# Patient Record
Sex: Male | Born: 1985 | Race: Black or African American | Hispanic: Yes | Marital: Married | Smoking: Never smoker
Health system: Southern US, Community
[De-identification: ages and names within clinical notes are randomized; demographics above are authoritative.]

## PROBLEM LIST (undated history)

## (undated) DIAGNOSIS — R0602 Shortness of breath: Secondary | ICD-10-CM

## (undated) DIAGNOSIS — K589 Irritable bowel syndrome without diarrhea: Secondary | ICD-10-CM

## (undated) DIAGNOSIS — M549 Dorsalgia, unspecified: Secondary | ICD-10-CM

## (undated) DIAGNOSIS — F419 Anxiety disorder, unspecified: Secondary | ICD-10-CM

## (undated) DIAGNOSIS — R079 Chest pain, unspecified: Secondary | ICD-10-CM

## (undated) DIAGNOSIS — K5900 Constipation, unspecified: Secondary | ICD-10-CM

## (undated) DIAGNOSIS — J309 Allergic rhinitis, unspecified: Secondary | ICD-10-CM

## (undated) DIAGNOSIS — K219 Gastro-esophageal reflux disease without esophagitis: Secondary | ICD-10-CM

## (undated) DIAGNOSIS — E739 Lactose intolerance, unspecified: Secondary | ICD-10-CM

## (undated) DIAGNOSIS — G4733 Obstructive sleep apnea (adult) (pediatric): Secondary | ICD-10-CM

## (undated) DIAGNOSIS — Z91018 Allergy to other foods: Secondary | ICD-10-CM

## (undated) DIAGNOSIS — J45909 Unspecified asthma, uncomplicated: Secondary | ICD-10-CM

## (undated) DIAGNOSIS — G8929 Other chronic pain: Secondary | ICD-10-CM

## (undated) DIAGNOSIS — R002 Palpitations: Secondary | ICD-10-CM

## (undated) HISTORY — PX: TOE SURGERY: SHX1073

## (undated) HISTORY — DX: Constipation, unspecified: K59.00

## (undated) HISTORY — DX: Dorsalgia, unspecified: M54.9

## (undated) HISTORY — DX: Shortness of breath: R06.02

## (undated) HISTORY — DX: Other chronic pain: G89.29

## (undated) HISTORY — DX: Lactose intolerance, unspecified: E73.9

## (undated) HISTORY — DX: Allergy to other foods: Z91.018

## (undated) HISTORY — DX: Chest pain, unspecified: R07.9

## (undated) HISTORY — DX: Irritable bowel syndrome, unspecified: K58.9

## (undated) HISTORY — DX: Gastro-esophageal reflux disease without esophagitis: K21.9

## (undated) HISTORY — DX: Morbid (severe) obesity due to excess calories: E66.01

## (undated) HISTORY — DX: Allergic rhinitis, unspecified: J30.9

## (undated) HISTORY — DX: Palpitations: R00.2

## (undated) HISTORY — DX: Obstructive sleep apnea (adult) (pediatric): G47.33

## (undated) HISTORY — PX: HERNIA REPAIR: SHX51

---

## 2016-08-03 DIAGNOSIS — G4733 Obstructive sleep apnea (adult) (pediatric): Secondary | ICD-10-CM

## 2016-08-03 DIAGNOSIS — J309 Allergic rhinitis, unspecified: Secondary | ICD-10-CM

## 2016-08-03 HISTORY — DX: Allergic rhinitis, unspecified: J30.9

## 2016-08-03 HISTORY — DX: Obstructive sleep apnea (adult) (pediatric): G47.33

## 2016-08-03 HISTORY — DX: Morbid (severe) obesity due to excess calories: E66.01

## 2017-12-02 ENCOUNTER — Emergency Department (INDEPENDENT_AMBULATORY_CARE_PROVIDER_SITE_OTHER)
Admission: EM | Admit: 2017-12-02 | Discharge: 2017-12-02 | Disposition: A | Discharge status: Home / Self Care | Payer: Self-pay | Source: Home / Self Care | Attending: Family Medicine | Admitting: Family Medicine

## 2017-12-02 ENCOUNTER — Other Ambulatory Visit: Payer: Self-pay

## 2017-12-02 ENCOUNTER — Emergency Department (INDEPENDENT_AMBULATORY_CARE_PROVIDER_SITE_OTHER): Payer: Self-pay

## 2017-12-02 DIAGNOSIS — S43401A Unspecified sprain of right shoulder joint, initial encounter: Secondary | ICD-10-CM

## 2017-12-02 DIAGNOSIS — M25511 Pain in right shoulder: Secondary | ICD-10-CM

## 2017-12-02 HISTORY — DX: Unspecified asthma, uncomplicated: J45.909

## 2017-12-02 NOTE — ED Provider Notes (Signed)
[facility]    CSN: [id number] Arrival date & time: 12/02/17  1313     History   Chief Complaint Chief Complaint  Patient presents with  . Shoulder Pain    HPI Darren Crawford is a 32 y.o. male.   Patient was playing basketball 5 days ago when he threw the ball overhand with his right arm.  His right shoulder was mildly sore afterwards, and the next day while showering he felt a "popping" sensation in his right shoulder.  Since then he has had increased pain and limited range of motion.  The history is provided by the patient.  Shoulder Injury  This is a new problem. Episode onset: 5 days ago. The problem occurs constantly. The problem has not changed since onset.Pertinent negatives include no chest pain. Exacerbated by: shoulder abduction. Nothing relieves the symptoms. Treatments tried: Aleve. The treatment provided no relief.    Past Medical History:  Diagnosis Date  . Asthma     There are no active problems to display for this patient.   History reviewed. No pertinent surgical history.     Home Medications    Prior to Admission medications   Medication Sig Start Date End Date Taking? Authorizing Provider  albuterol (PROVENTIL) (2.5 MG/3ML) 0.083% nebulizer solution Take 2.5 mg by nebulization every 6 (six) hours as needed for wheezing or shortness of breath.   Yes [provider]  beclomethasone (QVAR) 40 MCG/ACT inhaler Inhale 1 puff into the lungs daily.   Yes [provider]  cetirizine (ZYRTEC) 10 MG chewable tablet Chew 10 mg by mouth daily.   Yes [provider]  fluticasone (FLONASE) 50 MCG/ACT nasal spray Place into both nostrils daily.   Yes [provider]    Family History Family History  Family history unknown: Yes    Social History Social History   Tobacco Use  . Smoking status: Never Smoker  . Smokeless tobacco: Never Used  Substance Use Topics  . Alcohol use: Never    Frequency: Never  .  Drug use: Never     Allergies   Penicillins   Review of Systems Review of Systems  Cardiovascular: Negative for chest pain.  All other systems reviewed and are negative.    Physical Exam Triage Vital Signs ED Triage Vitals  Enc Vitals Group     BP 12/02/17 1400 108/84     Pulse Rate 12/02/17 1400 84     Resp 12/02/17 1400 18     Temp 12/02/17 1400 98.2 F (36.8 C)     Temp Source 12/02/17 1400 Oral     SpO2 12/02/17 1400 98 %     Weight 12/02/17 1401 282 lb (127.9 kg)     Height 12/02/17 1401 6\' 1"  (1.854 m)     Head Circumference --      Peak Flow --      Pain Score 12/02/17 1400 0     Pain Loc --      Pain Edu? --      Excl. in GC? --    No data found.  Updated Vital Signs BP 108/84 (BP Location: Right Arm)   Pulse 84   Temp 98.2 F (36.8 C) (Oral)   Resp 18   Ht 6\' 1"  (1.854 m)   Wt 282 lb (127.9 kg)   SpO2 98%   BMI 37.21 kg/m   Visual Acuity Right Eye Distance:   Left Eye Distance:   Bilateral Distance:    Right Eye  Near:   Left Eye Near:    Bilateral Near:     Physical Exam  Constitutional: He appears well-developed and well-nourished. No distress.  HENT:  Head: Normocephalic.  Eyes: Pupils are equal, round, and reactive to light.  Neck: Normal range of motion.  Cardiovascular: Normal heart sounds.  Pulmonary/Chest: Breath sounds normal.  Musculoskeletal:       Right shoulder: He exhibits decreased range of motion and tenderness.       Arms: Right shoulder has mild tenderness posteriorly.  There is good internal/external rotational strength. The patient cannot actively abduct above horizontal, and cannot passively abduct more than 10 degrees above horizontal.  Apley's test positive.  Empty can +/-      Neurological: He is alert.  Skin: Skin is warm and dry.  Nursing note and vitals reviewed.    UC Treatments / Results  Labs (all labs ordered are listed, but only abnormal results are displayed) Labs Reviewed - No data to  display  EKG None  Radiology Dg Shoulder Right  Result Date: 12/02/2017 CLINICAL DATA:  32 year old male with history of right shoulder injury playing basketball 1 week ago complaining of persistent pain. EXAM: RIGHT SHOULDER - 2+ VIEW COMPARISON:  No priors. FINDINGS: Small loose body within the joint space best appreciated adjacent to the superior aspect of the acetabulum. The chronicity of this finding is uncertain. No definite acute displaced fracture, subluxation or dislocation otherwise noted. IMPRESSION: 1. Small loose body in the superior aspect of the glenohumeral joint space. Electronically Signed   By: Trudie Reed M.D.   On: 12/02/2017 14:54    Procedures Procedures (including critical care time)  Medications Ordered in UC Medications - No data to display  Initial Impression / Assessment and Plan / UC Course  I have reviewed the triage vital signs and the nursing notes.  Pertinent labs & imaging results that were available during my care of the patient were reviewed by me and considered in my medical decision making (see chart for details).    Suspect rotator cuff injury.  Sling dispensed. Because of the finding of a loose body in the glenohumeral joint space, recommend follow-up with Sports medicine physician for further evaluation.   Final Clinical Impressions(s) / UC Diagnoses   Final diagnoses:  Sprain of right shoulder, unspecified shoulder sprain type, initial encounter     Discharge Instructions     Wear sling for about 5 days.  Apply ice pack for 20 to 30 minutes, 3 to 4 times daily  Continue until pain and swelling decrease.  May continue Aleve, two tabs every 12 hours.      ED Prescriptions    None         Lattie Haw, MD 12/04/17 [phone]

## 2017-12-02 NOTE — Discharge Instructions (Addendum)
Wear sling for about 5 days.  Apply ice pack for 20 to 30 minutes, 3 to 4 times daily  Continue until pain and swelling decrease.  May continue Aleve, two tabs every 12 hours.

## 2017-12-02 NOTE — ED Triage Notes (Signed)
Pt c/o R shoulder pain since last week. Was playing basketball and "threw a basketball like a baseball" - was showering the next day and heard a "pop" in his shoulder. Pain level 5 with limited ROM.

## 2017-12-13 ENCOUNTER — Encounter: Payer: Self-pay | Admitting: Family Medicine

## 2017-12-13 ENCOUNTER — Ambulatory Visit (INDEPENDENT_AMBULATORY_CARE_PROVIDER_SITE_OTHER): Payer: Self-pay | Admitting: Family Medicine

## 2017-12-13 VITALS — BP 119/73 | HR 80 | Ht 73.0 in | Wt 283.0 lb

## 2017-12-13 DIAGNOSIS — M7581 Other shoulder lesions, right shoulder: Secondary | ICD-10-CM

## 2017-12-13 DIAGNOSIS — S43431A Superior glenoid labrum lesion of right shoulder, initial encounter: Secondary | ICD-10-CM | POA: Insufficient documentation

## 2017-12-13 MED ORDER — DICLOFENAC SODIUM 1 % TD GEL: 2.0000 g | Freq: Four times a day (QID) | TRANSDERMAL | 11 refills | Status: DC

## 2017-12-13 NOTE — Patient Instructions (Signed)
Thank you for coming in today. Continue aleve as needed.  Apply the diclofenac gel up to 4x daily for pain as needed.  Do the exercises we dicussed.  Schedule with PT in 1-2 weeks especially if not better.  Recheck with me in 6 weeks.  Return sooner if needed or if worsening.

## 2017-12-14 ENCOUNTER — Encounter: Payer: Self-pay | Admitting: Family Medicine

## 2017-12-14 NOTE — Progress Notes (Signed)
Subjective:    I'm seeing this patient as a consultation for:  Dr Cathren Harsh  CC: Right shoulder pain.  HPI: Darren Crawford developed pain in his right shoulder a few weeks ago when throwing a basketball during a basketball game.  He threw it like a baseball and notes later that day he developed pain in his right shoulder.  He notes pain initially was in the lateral upper arm and quite significant and painful with overhead motion.  He denies any radiating pain or numbness.  He notes of the last few weeks the pain has been improving but is still quite significant present in the lateral upper arm.  He was seen in urgent care on June 26 where x-rays were unremarkable.  He has been using naproxen for pain control which has helped.  He notes pain in his shoulder is worse with overhead motion and at bedtime.  He denies any prior history of shoulder injury or any recent injury to the shoulder.   Past medical history, Surgical history, Family history not pertinant except as noted below, Social history, Allergies, and medications have been entered into the medical record, reviewed, and no changes needed.   Review of Systems: No headache, visual changes, nausea, vomiting, diarrhea, constipation, dizziness, abdominal pain, skin rash, fevers, chills, night sweats, weight loss, swollen lymph nodes, body aches, joint swelling, muscle aches, chest pain, shortness of breath, mood changes, visual or auditory hallucinations.   Objective:    Vitals:   12/13/17 1317  BP: 119/73  Pulse: 80   General: Well Developed, well nourished, and in no acute distress.  Neuro/Psych: Alert and oriented x3, extra-ocular muscles intact, able to move all 4 extremities, sensation grossly intact. Skin: Warm and dry, no rashes noted.  Respiratory: Not using accessory muscles, speaking in full sentences, trachea midline.  Cardiovascular: Pulses palpable, no extremity edema. Abdomen: Does not appear distended. MSK:  C-spine nontender to  midline normal neck motion. Right shoulder normal-appearing Nontender. Range of motion: Abduction full painful beyond 100 degrees.  External rotation full.  Internal rotation limited to the lumbar spine. Positive Hawkins and Neer's test. Positive empty can test. Strength is intact throughout. Negative Yergason's and speeds test. Positive crossover arm compression test. Positive O'Brien's test.  Contralateral left shoulder normal-appearing nontender normal motion normal strength negative impingement testing.  Pulses capillary refill and sensation are intact bilateral upper extremities.  Lab and Radiology Results EXAM: RIGHT SHOULDER - 2+ VIEW  COMPARISON:  No priors.  FINDINGS: Small loose body within the joint space best appreciated adjacent to the superior aspect of the acetabulum. The chronicity of this finding is uncertain. No definite acute displaced fracture, subluxation or dislocation otherwise noted.  IMPRESSION: 1. Small loose body in the superior aspect of the glenohumeral joint space.   Electronically Signed   By: Trudie Reed M.D.   On: 12/02/2017 14:54 I personally (independently) visualized and performed the interpretation of the images attached in this note.   Impression and Recommendations:    Assessment and Plan: 32 y.o. male with  Right shoulder pain due to rotator cuff tendinitis versus strain.  After discussion plan for home exercise program diclofenac gel and oral naproxen.  Plan additionally to proceed with formal physical therapy.  Recheck in 6 weeks.  Return sooner if needed.  If not better next step would be subacromial injection.  Orders Placed This Encounter  Procedures  . Ambulatory referral to Physical Therapy    Referral Priority:   Routine    Referral  Type:   Physical Medicine    Referral Reason:   Specialty Services Required    Requested Specialty:   Physical Therapy   Meds ordered this encounter  Medications  . diclofenac  sodium (VOLTAREN) 1 % GEL    Sig: Apply 2 g topically 4 (four) times daily. To affected joint.    Dispense:  100 g    Refill:  11    Discussed warning signs or symptoms. Please see discharge instructions. Patient expresses understanding.  CC: Dsa, Laurell RoofJoylin G, MD

## 2017-12-18 ENCOUNTER — Encounter: Payer: Self-pay | Admitting: Family Medicine

## 2017-12-25 ENCOUNTER — Ambulatory Visit (INDEPENDENT_AMBULATORY_CARE_PROVIDER_SITE_OTHER): Payer: Self-pay | Admitting: Rehabilitative and Restorative Service Providers"

## 2017-12-25 ENCOUNTER — Encounter: Payer: Self-pay | Admitting: Rehabilitative and Restorative Service Providers"

## 2017-12-25 DIAGNOSIS — R293 Abnormal posture: Secondary | ICD-10-CM

## 2017-12-25 DIAGNOSIS — R29898 Other symptoms and signs involving the musculoskeletal system: Secondary | ICD-10-CM

## 2017-12-25 DIAGNOSIS — M25511 Pain in right shoulder: Secondary | ICD-10-CM

## 2017-12-25 NOTE — Patient Instructions (Signed)
Axial Extension (Chin Tuck)    Pull chin in and lengthen back of neck. Hold __5__ seconds while counting out loud. Repeat __10__ times. Do __several__ sessions per day.  Shoulder Blade Squeeze    Rotate shoulders back, then squeeze shoulder blades down and back. Can do with a swim noodle. Repeat _10___ times. Do _several___ sessions per day.  Upper Back Strength: Lower Trapezius / Rotator Cuff " L's "     Arms in waitress pose, palms up. Press hands back and slide shoulder blades down. Hold for __5__ seconds. Repeat _10___ times. 1-2 times per day.    Scapular Retraction: Elbow Flexion (Standing)  "W's"     With elbows bent to 90, pinch shoulder blades together and rotate arms out, keeping elbows bent. Repeat __10__ times per set. Do __1-2__ sets per session. Do _several ___ sessions per day.  Scapula Adduction With Pectoralis Stretch: Low - Standing   Shoulders at 45 hands even with shoulders, keeping weight through legs, shift weight forward until you feel pull or stretch through the front of your chest. Hold _30__ seconds. Do _3__ times, _2-4__ times per day.   Scapula Adduction With Pectoralis Stretch: Mid-Range - Standing   Shoulders at 90 elbows even with shoulders, keeping weight through legs, shift weight forward until you feel pull or strength through the front of your chest. Hold __30_ seconds. Do _3__ times, __2-4_ times per day.   Scapula Adduction With Pectoralis Stretch: High - Standing   Shoulders at 120 hands up high on the doorway, keeping weight on feet, shift weight forward until you feel a pull or stretch through the front of your chest. Hold _30__ seconds. Do _3__ times, _2-3__ times per day.     TENS unit instructions: Do not shower or bathe with the unit on Turn the unit off before removing electrodes or batteries If the electrodes lose stickiness add a drop of water to the electrodes after they are disconnected from the unit and place  on plastic sheet. If you continued to have difficulty, call the TENS unit company to purchase more electrodes. Do not apply lotion on the skin area prior to use. Make sure the skin is clean and dry as this will help prolong the life of the electrodes. After use, always check skin for unusual red areas, rash or other skin difficulties. If there are any skin problems, does not apply electrodes to the same area. Never remove the electrodes from the unit by pulling the wires. Do not use the TENS unit or electrodes other than as directed. Do not change electrode placement without consultating your therapist or physician. Keep 2 fingers with between each electrode. Wear time ratio is 2:1, on to off times.         [facility] [address] [place], [place] [address]  [phone] (office) [phone] (fax)

## 2017-12-25 NOTE — Therapy (Signed)
[facility] [address] [place], [place], [address] Phone: [phone]   Fax:  [phone]  Physical Therapy Evaluation  Patient Details  Name: Darren Crawford MRN: [id number] Date of Birth: [birth year] Referring Provider: Dr Clementeen Graham    Encounter Date: 12/25/2017  PT End of Session - 12/25/17 1144    Visit Number  1    Number of Visits  12    Date for PT Re-Evaluation  02/05/18    PT Start Time  1144    PT Stop Time  1246    PT Time Calculation (min)  62 min    Activity Tolerance  Patient tolerated treatment well       Past Medical History:  Diagnosis Date  . Asthma   . Chronic allergic rhinitis 08/03/2016  . Morbid obesity (HCC) 08/03/2016  . Obstructive sleep apnea syndrome 08/03/2016    History reviewed. No pertinent surgical history.  There were no vitals filed for this visit.   Subjective Assessment - 12/25/17 1148    Subjective  Patient reports that he was throwing a basketball when he felt painin his shoudler which incresaed over the next several days. He was placed in a sling for 1 week. He has noticed some improvement in the constant pain but continues to have pain with certain motions.     Pertinent History  denies prior musculoskeletal problems or injuies     Currently in Pain?  No/denies    Pain Score  -- 7/10 with certain movements     Pain Location  Shoulder    Pain Orientation  Right    Pain Descriptors / Indicators  Sharp    Pain Type  Acute pain    Pain Onset  More than a month ago    Pain Frequency  Intermittent    Aggravating Factors   reaching up; reaching back; reaching out to the side; lying on Rt side     Pain Relieving Factors  resting at side; topical cream; OTC meds          [facility] PT Assessment - 12/25/17 0001      Assessment   Medical Diagnosis  Tendinitis Rt rotator cuff    Referring Provider  Dr Clementeen Graham     Onset Date/Surgical Date  11/27/17    Hand Dominance  Right    Next MD Visit  8/19    Prior Therapy  none       Precautions   Precautions  None      Balance Screen   Has the patient fallen in the past 6 months  No    Has the patient had a decrease in activity level because of a fear of falling?   No    Is the patient reluctant to leave their home because of a fear of falling?   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; games     Leisure  yard work; 3 children; walking 1-2 x/wk 20-30 min       Observation/Other Assessments   Focus on Therapeutic Outcomes (FOTO)   41% limitation       Sensation   Additional Comments  WNL's per pt report       Posture/Postural Control   Posture Comments  head forward; shoulders rounded and elevated; head of the humerus  anterior in orientation; scapulae abducted and rotated along the thoricic wall       AROM   Right Shoulder Extension  45 Degrees mild pain    Right Shoulder Flexion  133 Degrees painful    Right Shoulder ABduction  96 Degrees painful    Right Shoulder Internal Rotation  30 Degrees painful    Right Shoulder External Rotation  80 Degrees    Left Shoulder Extension  47 Degrees    Left Shoulder Flexion  152 Degrees    Left Shoulder ABduction  155 Degrees    Left Shoulder Internal Rotation  35 Degrees    Left Shoulder External Rotation  95 Degrees    Cervical Flexion  65    Cervical Extension  52    Cervical - Right Side Bend  32    Cervical - Left Side Bend  37    Cervical - Right Rotation  68    Cervical - Left Rotation  66      Palpation   Palpation comment  tenderness palpation Rt pects, biceps                Objective measurements completed on examination: See above findings.      [facility] Adult PT Treatment/Exercise - 12/25/17 0001      Therapeutic Activites    Therapeutic Activities  -- Myofacial ball release work       Neuro Re-ed    Neuro  Re-ed Details   postural correction lifting chest; engaging posterior shoudler girdle musculature       Shoulder Exercises: Standing   Other Standing Exercises  axial extension 10 sec x 5; scap squeeze 10 sec x 10; L's x 10' W's x 10 with noodle       Shoulder Exercises: Stretch   Other Shoulder Stretches  3 way doorway stretch 30 sec x 1 reps each position       Moist Heat Therapy   Number Minutes Moist Heat  20 Minutes    Moist Heat Location  Shoulder Rt in sitting UE supported on pillow       Electrical Stimulation   Electrical Stimulation Location  Rt shoulder    Electrical Stimulation Action  IFC    Electrical Stimulation Parameters  to tolerance    Electrical Stimulation Goals  Pain;Tone             PT Education - 12/25/17 1235    Education Details  HEP     Person(s) Educated  Patient    Methods  Explanation;Demonstration;Tactile cues;Verbal cues;Handout    Comprehension  Verbalized understanding;Returned demonstration;Verbal cues required;Tactile cues required          PT Long Term Goals - 12/25/17 1244      PT LONG TERM GOAL #1   Title  Improve posture and alignment with patient to demonstrate improved upright posture with posterior shoulder girdle engaged 02/05/18    Time  6    Period  Weeks    Status  New      PT LONG TERM GOAL #2   Title  Increased pain freee ROM Rt shoulder AROM to equal or greater than Lt shoulder AROM 02/05/18    Time  6    Period  Weeks    Status  New      PT LONG TERM GOAL #3   Title  Patient reports 75-80% improvement in Rt shoulder pain with ability to lie on Rt side for 1-2 hours 02/05/18    Time  6    Period  Weeks    Status  New      PT LONG TERM GOAL #4   Title  Independent in HEP 02/05/18    Time  6    Period  Weeks    Status  New      PT LONG TERM GOAL #5   Title  Improve FOTO to >/= 29% limitation 02/05/18    Time  6    Period  Weeks    Status  New             Plan - 12/25/17 1239    Clinical Impression  Statement  Darren Crawford presents with ~ 1 month history of Rt shoudler pain following injury throwing a basketball at church camp. He has persistent pain with elevation of Rt UE; lifting; reaching. He has poor posture and alignment; limited ROM/mobility; muscular tightness to palpation; pain with functional activities. He will benefit form PT to address problems identified     Clinical Presentation  Stable    Clinical Decision Making  Low    Rehab Potential  Good    Clinical Impairments Affecting Rehab Potential  rounded posture; limited UE ROM; sedentary     PT Frequency  2x / week    PT Treatment/Interventions  Patient/family education;ADLs/Self Care Home Management;Electrical Stimulation;Cryotherapy;Iontophoresis 4mg /ml Dexamethasone;Moist Heat;Ultrasound;Dry needling;Manual techniques;Neuromuscular re-education;Therapeutic activities;Therapeutic exercise    PT Next Visit Plan  review HEP; add biceps stretch, prolonged snow angel; progress with posterior shoulder girdle strengthening as posture is corrected; manual work and modalities as indicated      Financial plannerConsulted and Agree with Plan of Care  Patient       Patient will benefit from skilled therapeutic intervention in order to improve the following deficits and impairments:  Postural dysfunction, Improper body mechanics, Pain, Increased fascial restricitons, Increased muscle spasms, Decreased mobility, Decreased range of motion, Decreased activity tolerance  Visit Diagnosis: Acute pain of right shoulder - Plan: PT plan of care cert/re-cert  Other symptoms and signs involving the musculoskeletal system - Plan: PT plan of care cert/re-cert  Abnormal posture - Plan: PT plan of care cert/re-cert     Problem List Patient Active Problem List   Diagnosis Date Noted  . Tendinitis of right rotator cuff 12/13/2017  . Chronic allergic rhinitis 08/03/2016  . Morbid obesity (HCC) 08/03/2016  . Obstructive sleep apnea syndrome 08/03/2016    Anylah Scheib Rober MinionP Esli Clements  PT, MPH  12/25/2017, 12:50 PM  [facility] [address] [place], [place], [address] Phone: [phone]   Fax:  [phone]  Name: Darren Crawford MRN: [id number] Date of Birth: 02-05-1986

## 2017-12-29 ENCOUNTER — Ambulatory Visit (INDEPENDENT_AMBULATORY_CARE_PROVIDER_SITE_OTHER): Payer: Self-pay | Admitting: Physical Therapy

## 2017-12-29 DIAGNOSIS — R293 Abnormal posture: Secondary | ICD-10-CM

## 2017-12-29 DIAGNOSIS — R29898 Other symptoms and signs involving the musculoskeletal system: Secondary | ICD-10-CM

## 2017-12-29 DIAGNOSIS — M25511 Pain in right shoulder: Secondary | ICD-10-CM

## 2017-12-29 NOTE — Therapy (Signed)
[facility] [address] [place], [place], [address] Phone: [phone]   Fax:  [phone]  Physical Therapy Treatment  Patient Details  Name: Darren Crawford MRN: [id number] Date of Birth: 1986/06/11 Referring Provider: Dr. Clementeen GrahamEvan Corey   Encounter Date: 12/29/2017  PT End of Session - 12/29/17 1341    Visit Number  2    Number of Visits  12    Date for PT Re-Evaluation  02/05/18    PT Start Time  1340    PT Stop Time  1429    PT Time Calculation (min)  49 min    Activity Tolerance  Patient tolerated treatment well;No increased pain    Behavior During Therapy  WFL for tasks assessed/performed       Past Medical History:  Diagnosis Date  . Asthma   . Chronic allergic rhinitis 08/03/2016  . Morbid obesity (HCC) 08/03/2016  . Obstructive sleep apnea syndrome 08/03/2016    No past surgical history on file.  There were no vitals filed for this visit.  Subjective Assessment - 12/29/17 1342    Subjective  Pt reports he only has pain certain positions (ie: reaching behind or overhead).  He still can't sleep on Rt side without pain.  He has been doing exercises 1x/day.     Currently in Pain?  No/denies    Pain Score  0-No pain up to 6/10 with certain motions    Pain Location  Shoulder    Pain Orientation  Right         [facility] - 12/29/17 0001      Assessment   Medical Diagnosis  Rt rotator cuff tendinitis    Referring Provider  Dr. Clementeen GrahamEvan Corey    Onset Date/Surgical Date  11/27/17    Hand Dominance  Right    Next MD Visit  01/24/18       [facility]/Ex[facility] - 12/29/17 0001      Shoulder Exercises: Supine   Other Supine Exercises  prolonged snow angel, with arms abdct 90 deg x 30 sec x 4 reps      Shoulder Exercises: Seated   Other Seated Exercises  thoracic ext over back of chair, 3 sec holds x 5 reps      Shoulder Exercises: Standing   Other Standing Exercises  axial extension 10  sec x 5; scap squeeze 10 sec x 10; L's x 10' W's x 10 with noodle       Shoulder Exercises: Stretch   Other Shoulder Stretches  3 way doorway stretch 30 sec x 3 reps each position, unilateral high positions      Other Shoulder Stretches  Rt tricep stretch x 30 sec x 2 reps (1 rep on LUE);  Rt bicep stretch at door (not much felt)- switched to hands laced behind back stretch x 15 sec x 3 reps       Moist Heat Therapy   Number Minutes Moist Heat  15 Minutes    Moist Heat Location  Shoulder Rt in sitting UE supported on pillow       Electrical Stimulation   Electrical Stimulation Location  Rt shoulder    Electrical Stimulation Action  TENS    Electrical Stimulation Parameters  to tolerance    Electrical Stimulation Goals  Pain         PT Long Term Goals - 12/29/17 1553      PT LONG TERM GOAL #1   Title  Improve  posture and alignment with patient to demonstrate improved upright posture with posterior shoulder girdle engaged 02/05/18    Time  6    Period  Weeks    Status  On-going      PT LONG TERM GOAL #2   Title  Increased pain free ROM Rt shoulder AROM to equal or greater than Lt shoulder AROM 02/05/18    Time  6    Period  Weeks    Status  On-going      PT LONG TERM GOAL #3   Title  Patient reports 75-80% improvement in Rt shoulder pain with ability to lie on Rt side for 1-2 hours 02/05/18    Time  6    Period  Weeks    Status  On-going      PT LONG TERM GOAL #4   Title  Independent in HEP 02/05/18    Time  6    Period  Weeks    Status  On-going      PT LONG TERM GOAL #5   Title  Improve FOTO to >/= 29% limitation 02/05/18    Time  6    Period  Weeks    Status  On-going            Plan - 12/29/17 1550    Clinical Impression Statement  Pt reporting slight decrease in pain over last week.  He tolerated most exercises well, making small modifications to exercises for form or technique.  Time spent educating pt on shoulder musculature and importance of good posture  with movement, as well as avoiding aggrivating factors that impinge ant shoulder.  Pt verbalized understanding.  Pt progressing towards goals.     Rehab Potential  Good    PT Frequency  2x / week    PT Treatment/Interventions  Patient/family education;ADLs/Self Care Home Management;Electrical Stimulation;Cryotherapy;Iontophoresis 4mg /ml Dexamethasone;Moist Heat;Ultrasound;Dry needling;Manual techniques;Neuromuscular re-education;Therapeutic activities;Therapeutic exercise    PT Next Visit Plan  progress posterior shoulder girdle strengthening; manual therapy to shoulder.      Consulted and Agree with Plan of Care  Patient       Patient will benefit from skilled therapeutic intervention in order to improve the following deficits and impairments:  Postural dysfunction, Improper body mechanics, Pain, Increased fascial restricitons, Increased muscle spasms, Decreased mobility, Decreased range of motion, Decreased activity tolerance  Visit Diagnosis: Acute pain of right shoulder  Other symptoms and signs involving the musculoskeletal system  Abnormal posture     Problem List Patient Active Problem List   Diagnosis Date Noted  . Tendinitis of right rotator cuff 12/13/2017  . Chronic allergic rhinitis 08/03/2016  . Morbid obesity (HCC) 08/03/2016  . Obstructive sleep apnea syndrome 08/03/2016   Mayer Camel, PTA 12/29/17 3:55 PM  [facility] [place] [address] [place], [place], [address] Phone: [phone]   Fax:  [phone]  Name: Darren Crawford MRN: [id number] Date of Birth: [birth year]

## 2017-12-29 NOTE — Patient Instructions (Signed)
Chest / Bicep Stretch    Lace fingers behind back and squeeze shoulder blades together. Slowly raise and straighten arms. Hold _15-30___ seconds. Repeat _2___ times per set. Do __1__ sets per session. Do _2___ sessions per day.  [place]: Single Arm    Arms near sides, palms up. Press one arm lightly into floor, slide arm out to side and up alongside head. Keep contact with floor throughout motion. At maximal position, lengthen arm. Hold __30_ seconds. Relax. Repeat _2-3__ times. Slide arm back to start.    Thoracic: Stretch - Lean Back (Chair)    Get ON TARGET. Sit on a low firm-backed chair, hands behind head. 3- Elbows leading motion, lean back, arching upper body. Avoid undue pressure or quick stretching. Hold _3-5__ seconds. Repeat _3__ times. Do _several__ sessions per day.   [facility] [address] [place], [place] [address]  [phone] (office) [phone] (fax)

## 2018-01-01 ENCOUNTER — Ambulatory Visit (INDEPENDENT_AMBULATORY_CARE_PROVIDER_SITE_OTHER): Payer: Self-pay | Admitting: Physical Therapy

## 2018-01-01 DIAGNOSIS — M25511 Pain in right shoulder: Secondary | ICD-10-CM

## 2018-01-01 DIAGNOSIS — R293 Abnormal posture: Secondary | ICD-10-CM

## 2018-01-01 DIAGNOSIS — R29898 Other symptoms and signs involving the musculoskeletal system: Secondary | ICD-10-CM

## 2018-01-01 NOTE — Patient Instructions (Addendum)
Resisted External Rotation: in Neutral - Bilateral  PALMS UP!!! Sit or stand, tubing in both hands, elbows at sides, bent to 90, forearms forward. Pinch shoulder blades together and rotate forearms out. Keep elbows at sides. Repeat __10__ times per set. Do __2-3__ sets per session. Do __3-4__ sessions per week.  Resistive Band Rowing   With resistive band anchored in door, grasp both ends. Keeping elbows bent, pull back, squeezing shoulder blades together. Hold _3-5___ seconds. Repeat _10___ times, 2-3 sets. Do __3-4__ sessions per week.   Strengthening: Resisted Extension   Hold tubing with both hands, arms forward. Pull arms back, elbow straight. Pause 3 seconds.  Repeat _10___ times per set. Do _2-3___ sets per session. Do  3-4___ sessions per week.  Sash   On back, knees bent, feet flat, left hand on left hip, right hand above left. Pull right arm DIAGONALLY (hip to shoulder) across chest. Bring right arm along head toward floor - lead with thumb. Hold momentarily. Slowly return to starting position. Repeat _10__ times, 2 sets. Do with left, then right arm. Band color __red____      [facility] [address] [place], [place] [address]  [phone] (office) [phone] (fax)

## 2018-01-01 NOTE — Therapy (Signed)
[facility] [place] [address] [place], [place], [address] Phone: [phone]   Fax:  [phone]  Physical Therapy Treatment  Patient Details  Name: Darren Crawford MRN: [id number] Date of Birth: 11/28/1985 Referring Provider: Dr. Clementeen Graham   Encounter Date: 01/01/2018  PT End of Session - 01/01/18 1224    Visit Number  3    Number of Visits  12    Date for PT Re-Evaluation  02/05/18    PT Start Time  1147    PT Stop Time  1241 MHP last 15 min    PT Time Calculation (min)  54 min       Past Medical History:  Diagnosis Date  . Asthma   . Chronic allergic rhinitis 08/03/2016  . Morbid obesity (HCC) 08/03/2016  . Obstructive sleep apnea syndrome 08/03/2016    No past surgical history on file.  There were no vitals filed for this visit.  Subjective Assessment - 01/01/18 1149    Subjective  Darren Crawford reports his Rt shoulder is feeling some better.  He reports reaching overhead is not as painful, however reaching into back seat (shoulder ext, with IR) is - pain up to 7/10. He has been performing exercises 1-3x/day.  He has more aware of posture, trying to avoid leaning into Rt elbow when sitting at desk.     Currently in Pain?  No/denies    Pain Score  0-No pain    Aggravating Factors   reaching back/ behind back    Pain Relieving Factors  topical cream, rest         [facility] PT Assessment - 01/01/18 0001      Assessment   Medical Diagnosis  Rt rotator cuff tendinitis    Referring Provider  Dr. Clementeen Graham    Onset Date/Surgical Date  11/27/17    Hand Dominance  Right    Next MD Visit  01/24/18      AROM   Right Shoulder Extension  45 Degrees    Right Shoulder Flexion  151 Degrees mild pain at end range    Right Shoulder ABduction  143 Degrees pain at 96 abdct; improved to 143 in scaption     Right Shoulder Internal Rotation  45 Degrees    Right Shoulder External Rotation  91 Degrees       [facility] Adult PT  Treatment/Exercise - 01/01/18 0001      Shoulder Exercises: Supine   Other Supine Exercises  Rt sash with yellow band x 5, with red band x 10       Shoulder Exercises: Standing   Extension  Strengthening;Both;10 reps;Theraband 3 sec hold in retraction    Theraband Level (Shoulder Extension)  Level 2 (Red)    Row  Both;10 reps;Theraband 3 sec hold in retraction    Theraband Level (Shoulder Row)  Level 2 (Red)    Retraction  Both;12 reps;Theraband    Theraband Level (Shoulder Retraction)  Level 1 (Yellow)    Other Standing Exercises  W's x 3 sec hold x 10      Shoulder Exercises: ROM/Strengthening   UBE (Upper Arm Bike)  L1: 1 min forward/ 1 min backward      Shoulder Exercises: Stretch   Internal Rotation Stretch  5 reps 10 sec holds    Other Shoulder Stretches  3 way doorway stretch 30 sec x 3 reps each position, unilateral high positions      Other Shoulder Stretches  Rt tricep stretch x 30 sec  x 2 reps (1 rep on LUE);  Rt bicep stretch -  behind back stretch x 15 sec x 3 reps       Moist Heat Therapy   Number Minutes Moist Heat  15 Minutes    Moist Heat Location  Shoulder Rt in sitting UE supported on pillow              PT Education - 01/01/18 1236    Education Details  HEP - yellow and red band issued    Person(s) Educated  Patient    Methods  Explanation;Handout;Verbal cues;Tactile cues;Demonstration    Comprehension  Verbalized understanding;Returned demonstration          PT Long Term Goals - 12/29/17 1553      PT LONG TERM GOAL #1   Title  Improve posture and alignment with patient to demonstrate improved upright posture with posterior shoulder girdle engaged 02/05/18    Time  6    Period  Weeks    Status  On-going      PT LONG TERM GOAL #2   Title  Increased pain free ROM Rt shoulder AROM to equal or greater than Lt shoulder AROM 02/05/18    Time  6    Period  Weeks    Status  On-going      PT LONG TERM GOAL #3   Title  Patient reports 75-80%  improvement in Rt shoulder pain with ability to lie on Rt side for 1-2 hours 02/05/18    Time  6    Period  Weeks    Status  On-going      PT LONG TERM GOAL #4   Title  Independent in HEP 02/05/18    Time  6    Period  Weeks    Status  On-going      PT LONG TERM GOAL #5   Title  Improve FOTO to >/= 29% limitation 02/05/18    Time  6    Period  Weeks    Status  On-going            Plan - 01/01/18 1237    Clinical Impression Statement  Pt demonstrated improved Rt shoulder ROM.  He continues to have pain with Rt shoulder active ROM with abdct at ~96 deg.  He tolerated all exercises well, with fatigue and slight soreness in shoulder after exercise.  Soreness reduced with use of MHP at end of session (declined estim).  Pt progressing well towards establishd goals.     Rehab Potential  Good    Clinical Impairments Affecting Rehab Potential  rounded posture; limited UE ROM; sedentary     PT Frequency  2x / week    PT Duration  6 weeks    PT Treatment/Interventions  Patient/family education;ADLs/Self Care Home Management;Electrical Stimulation;Cryotherapy;Iontophoresis 4mg /ml Dexamethasone;Moist Heat;Ultrasound;Dry needling;Manual techniques;Neuromuscular re-education;Therapeutic activities;Therapeutic exercise    PT Next Visit Plan  progress posterior shoulder girdle strengthening; manual therapy to shoulder with IASTM.      Consulted and Agree with Plan of Care  Patient       Patient will benefit from skilled therapeutic intervention in order to improve the following deficits and impairments:  Postural dysfunction, Improper body mechanics, Pain, Increased fascial restricitons, Increased muscle spasms, Decreased mobility, Decreased range of motion, Decreased activity tolerance  Visit Diagnosis: Acute pain of right shoulder  Other symptoms and signs involving the musculoskeletal system  Abnormal posture     Problem List Patient Active Problem List   Diagnosis Date  Noted  .  Tendinitis of right rotator cuff 12/13/2017  . Chronic allergic rhinitis 08/03/2016  . Morbid obesity (HCC) 08/03/2016  . Obstructive sleep apnea syndrome 08/03/2016   Mayer CamelJennifer Carlson-Long, PTA 01/01/18 1:10 PM  [facility] [address] [place], [place], [address] Phone: [phone]   Fax:  [phone]  Name: Dara Hoyerfren Pagliuca MRN: [id number] Date of Birth: 08/04/1985

## 2018-01-04 ENCOUNTER — Encounter: Payer: Self-pay | Admitting: Rehabilitative and Restorative Service Providers"

## 2018-01-08 ENCOUNTER — Ambulatory Visit (INDEPENDENT_AMBULATORY_CARE_PROVIDER_SITE_OTHER): Payer: Self-pay | Admitting: Rehabilitative and Restorative Service Providers"

## 2018-01-08 ENCOUNTER — Encounter: Payer: Self-pay | Admitting: Rehabilitative and Restorative Service Providers"

## 2018-01-08 DIAGNOSIS — R293 Abnormal posture: Secondary | ICD-10-CM

## 2018-01-08 DIAGNOSIS — M25511 Pain in right shoulder: Secondary | ICD-10-CM

## 2018-01-08 DIAGNOSIS — R29898 Other symptoms and signs involving the musculoskeletal system: Secondary | ICD-10-CM

## 2018-01-08 NOTE — Therapy (Addendum)
[facility] [address] [place], [place], [address] Phone: [phone]   Fax:  [phone]  Physical Therapy Treatment  Patient Details  Name: Darren Crawford MRN: [id number] Date of Birth: 1985/11/04 Referring Provider: Dr. Clementeen GrahamEvan Corey   Encounter Date: 01/08/2018  PT End of Session - 01/08/18 1517    Visit Number  4    Number of Visits  12    Date for PT Re-Evaluation  02/05/18    PT Start Time  1518    PT Stop Time  1615    PT Time Calculation (min)  57 min    Activity Tolerance  Patient tolerated treatment well       Past Medical History:  Diagnosis Date  . Asthma   . Chronic allergic rhinitis 08/03/2016  . Morbid obesity (HCC) 08/03/2016  . Obstructive sleep apnea syndrome 08/03/2016    History reviewed. No pertinent surgical history.  There were no vitals filed for this visit.  Subjective Assessment - 01/08/18 1520    Subjective  Patient reports that he was reaching back to hand someone a water bottle Friday when he felt a sharp pain in the Rt shoulder. Pain and soreness have continued to a somewhat lesser extent. He moved some way last knight to get comfortable and felt  sharp pain again. He has had persistent pain since.     Currently in Pain?  Yes    Pain Score  4     Pain Location  Shoulder    Pain Orientation  Right    Pain Descriptors / Indicators  Sore    Pain Type  Acute pain    Pain Onset  More than a month ago    Pain Frequency  Intermittent                       [facility] Adult PT Treatment/Exercise - 01/08/18 0001      Shoulder Exercises: Supine   Other Supine Exercises  prolonged snow angel 2-3 min       Shoulder Exercises: Standing   Extension  Strengthening;Both;10 reps;Theraband 3 sec hold in retraction    Theraband Level (Shoulder Extension)  Level 2 (Red)    Row  Both;10 reps;Theraband 3 sec hold in retraction    Theraband Level (Shoulder Row)  Level 2 (Red)    Retraction  Both;12 reps;Theraband    Theraband Level (Shoulder Retraction)  Level 1 (Yellow)    Other Standing Exercises  W's x 3 sec hold x 10      Shoulder Exercises: ROM/Strengthening   UBE (Upper Arm Bike)  L3 4 min alt fwd/back each minute       Shoulder Exercises: Stretch   Other Shoulder Stretches  3 way doorway stretch 30 sec x 2 reps position       Moist Heat Therapy   Number Minutes Moist Heat  20 Minutes    Moist Heat Location  Shoulder Rt in sitting UE supported on pillow       Electrical Stimulation   Electrical Stimulation Location  Rt shoulder    Electrical Stimulation Action  IFC    Electrical Stimulation Parameters  to tolerance    Electrical Stimulation Goals  Pain;Tone      Iontophoresis   Type of Iontophoresis  Dexamethasone    Location  anterior Rt shoulder    Dose  80 mAmp    Time  14 hours       Manual Therapy  Manual therapy comments  pt supine     Joint Mobilization  GH joint mobs    Soft tissue mobilization  deep tissue work through the anterior shoulder/pecs/biceps     Myofascial Release  anterior chest to anterior shoulder    Passive ROM  Rt shoulder     Manual Traction  pulling through the Rt UE at side              PT Education - 01/08/18 1617    Education Details  ionto     Person(s) Educated  Patient    Methods  Explanation    Comprehension  Verbalized understanding          PT Long Term Goals - 01/08/18 1557      PT LONG TERM GOAL #1   Title  Improve posture and alignment with patient to demonstrate improved upright posture with posterior shoulder girdle engaged 02/05/18    Time  6    Period  Weeks    Status  On-going      PT LONG TERM GOAL #2   Title  Increased pain free ROM Rt shoulder AROM to equal or greater than Lt shoulder AROM 02/05/18    Time  6    Period  Weeks    Status  On-going      PT LONG TERM GOAL #3   Title  Patient reports 75-80% improvement in Rt shoulder pain with ability to lie on Rt side for 1-2  hours 02/05/18    Time  6    Period  Weeks    Status  On-going      PT LONG TERM GOAL #4   Title  Independent in HEP 02/05/18    Time  6    Period  Weeks    Status  On-going      PT LONG TERM GOAL #5   Title  Improve FOTO to >/= 29% limitation 02/05/18    Time  6    Period  Weeks    Status  On-going            Plan - 01/08/18 1523    Clinical Impression Statement  Flare up of Rt shoulder pain following reaching backward 01/05/18 and moving to change positions in bed last night. Patient continues to have limited AROM and muscular tightness to palpation through the Rt shoulder girdle.     PT Frequency  2x / week    PT Duration  6 weeks    PT Treatment/Interventions  Patient/family education;ADLs/Self Care Home Management;Electrical Stimulation;Cryotherapy;Iontophoresis 4mg /ml Dexamethasone;Moist Heat;Ultrasound;Dry needling;Manual techniques;Neuromuscular re-education;Therapeutic activities;Therapeutic exercise    PT Next Visit Plan  progress posterior shoulder girdle strengthening; assess response to manual therapy to shoulder; assess response to ionto       Consulted and Agree with Plan of Care  Patient       Patient will benefit from skilled therapeutic intervention in order to improve the following deficits and impairments:  Postural dysfunction, Improper body mechanics, Pain, Increased fascial restricitons, Increased muscle spasms, Decreased mobility, Decreased range of motion, Decreased activity tolerance  Visit Diagnosis: Acute pain of right shoulder  Other symptoms and signs involving the musculoskeletal system  Abnormal posture     Problem List Patient Active Problem List   Diagnosis Date Noted  . Tendinitis of right rotator cuff 12/13/2017  . Chronic allergic rhinitis 08/03/2016  . Morbid obesity (HCC) 08/03/2016  . Obstructive sleep apnea syndrome 08/03/2016    Celyn Rober Minion PT, MPH  01/08/2018,  4:17 PM  [facility] [address] [place], [place], [address] Phone: [phone]   Fax:  [phone]  Name: Darren Crawford MRN: [id number] Date of Birth: [birth year]

## 2018-01-08 NOTE — Patient Instructions (Signed)

## 2018-01-12 ENCOUNTER — Ambulatory Visit (INDEPENDENT_AMBULATORY_CARE_PROVIDER_SITE_OTHER): Payer: Self-pay | Admitting: Physical Therapy

## 2018-01-12 DIAGNOSIS — M25511 Pain in right shoulder: Secondary | ICD-10-CM

## 2018-01-12 DIAGNOSIS — R29898 Other symptoms and signs involving the musculoskeletal system: Secondary | ICD-10-CM

## 2018-01-12 DIAGNOSIS — R293 Abnormal posture: Secondary | ICD-10-CM

## 2018-01-12 NOTE — Therapy (Signed)
[facility] [place] [address] [place] [address] [place], [place], [address] Phone: [phone]   Fax:  [phone]  Physical Therapy Treatment  Patient Details  Name: Darren Crawford MRN: [id number] Date of Birth: September 19, 1985 Referring Provider: Dr. Clementeen Graham    Encounter Date: 01/12/2018  PT End of Session - 01/12/18 1406    Visit Number  5    Number of Visits  12    Date for PT Re-Evaluation  02/05/18    PT Start Time  1402    PT Stop Time  1455    PT Time Calculation (min)  53 min       Past Medical History:  Diagnosis Date  . Asthma   . Chronic allergic rhinitis 08/03/2016  . Morbid obesity (HCC) 08/03/2016  . Obstructive sleep apnea syndrome 08/03/2016    No past surgical history on file.  There were no vitals filed for this visit.  Subjective Assessment - 01/12/18 1406    Subjective  Pt reports he is still feeling sore from the last treatment/ reaching in back seat last week.  "It's constant and annoying now; it just won't go away now".   He received his TENS unit in mail last night. He would like some education on how to operate unit.     Currently in Pain?  Yes    Pain Score  3     Pain Location  Shoulder    Pain Orientation  Right    Pain Descriptors / Indicators  Sore    Aggravating Factors   reaching back/ behind back    Pain Relieving Factors  topical cream, rest         [facility] PT Assessment - 01/12/18 0001      Assessment   Medical Diagnosis  Rt rotator cuff tendinitis    Referring Provider  Dr. Clementeen Graham     Onset Date/Surgical Date  11/27/17    Hand Dominance  Right    Next MD Visit  01/24/18      AROM   Right Shoulder Flexion  145 Degrees "stabbing" pain, at end range    Right Shoulder ABduction  132 Degrees pain at end range        [facility] Adult PT Treatment/Exercise - 01/12/18 0001      Self-Care   Self-Care  Other Self-Care Comments    Other Self-Care Comments   educated pt on how to operate TENS unit  (parameters and safety)- pt verbalized understanding and returned demo.       Shoulder Exercises: Standing   Row  Both;10 reps;Theraband    Theraband Level (Shoulder Row)  Level 1 (Yellow) pt reported increased shoulder pain upon completing set    Retraction  -- held    Other Standing Exercises  W's x 5 sec holds x 10, L's x 5 sec x 10 - pt reported shoulde pain increasing (despite modifications to posture and range)      Shoulder Exercises: ROM/Strengthening   UBE (Upper Arm Bike)  L1: 30 sec each direction       Shoulder Exercises: Isometric Strengthening   Flexion  5X5"    Extension  5X5"    External Rotation  5X5" cues for form    Internal Rotation  5X5" cues for form.       Shoulder Exercises: Stretch   Other Shoulder Stretches  3 way doorway stretch 30 sec x 2 reps position - high level position painful; had pt stop this and repeat mid-level  instead with improved response.     Other Shoulder Stretches  Rt bicep stretch x 20 x 3 reps      Modalities   Modalities  Vasopneumatic;Electrical Stimulation      Electrical Stimulation   Electrical Stimulation Location  Rt shoulder     Electrical Stimulation Action  IFC    Electrical Stimulation Parameters  to tolerance    Electrical Stimulation Goals  Pain;Tone      Iontophoresis   Type of Iontophoresis  Dexamethasone    Location  Rt bicep origin    Dose  1.0 cc    Time  12 hr - 120 mA      Vasopneumatic   Number Minutes Vasopneumatic   15 minutes    Vasopnuematic Location   Shoulder    Vasopneumatic Pressure  Low    Vasopneumatic Temperature   34 deg                  PT Long Term Goals - 01/08/18 1557      PT LONG TERM GOAL #1   Title  Improve posture and alignment with patient to demonstrate improved upright posture with posterior shoulder girdle engaged 02/05/18    Time  6    Period  Weeks    Status  On-going      PT LONG TERM GOAL #2   Title  Increased pain free ROM Rt shoulder AROM to equal or greater  than Lt shoulder AROM 02/05/18    Time  6    Period  Weeks    Status  On-going      PT LONG TERM GOAL #3   Title  Patient reports 75-80% improvement in Rt shoulder pain with ability to lie on Rt side for 1-2 hours 02/05/18    Time  6    Period  Weeks    Status  On-going      PT LONG TERM GOAL #4   Title  Independent in HEP 02/05/18    Time  6    Period  Weeks    Status  On-going      PT LONG TERM GOAL #5   Title  Improve FOTO to >/= 29% limitation 02/05/18    Time  6    Period  Weeks    Status  On-going            Plan - 01/12/18 1641    Clinical Impression Statement  Pt has had a flare up in Rt shoulder pain since last week.  His Rt shoulder ROM is decreased from last assessment.  He had limited tolerance for exercise; modified HEP while shoulder calms down.  Encouraged pt to use ice and TENS to help decrease pain.  Limited progress towards goals due to flare up.      Rehab Potential  Good    Clinical Impairments Affecting Rehab Potential  rounded posture; limited UE ROM; sedentary     PT Frequency  2x / week    PT Duration  6 weeks    PT Treatment/Interventions  Patient/family education;ADLs/Self Care Home Management;Electrical Stimulation;Cryotherapy;Iontophoresis 4mg /ml Dexamethasone;Moist Heat;Ultrasound;Dry needling;Manual techniques;Neuromuscular re-education;Therapeutic activities;Therapeutic exercise    PT Next Visit Plan  assess response to 2nd ionto and modified HEP.      Consulted and Agree with Plan of Care  Patient       Patient will benefit from skilled therapeutic intervention in order to improve the following deficits and impairments:  Postural dysfunction, Improper body mechanics, Pain, Increased fascial  restricitons, Increased muscle spasms, Decreased mobility, Decreased range of motion, Decreased activity tolerance  Visit Diagnosis: Acute pain of right shoulder  Other symptoms and signs involving the musculoskeletal system  Abnormal  posture     Problem List Patient Active Problem List   Diagnosis Date Noted  . Tendinitis of right rotator cuff 12/13/2017  . Chronic allergic rhinitis 08/03/2016  . Morbid obesity (HCC) 08/03/2016  . Obstructive sleep apnea syndrome 08/03/2016   Mayer CamelJennifer Carlson-Long, PTA 01/12/18 4:49 PM  [facility] [address] [place] [address] [place], [place], [address] Phone: [phone]   Fax:  [phone]  Name: Darren Crawford MRN: [id number] Date of Birth: Apr 29, 1986

## 2018-01-12 NOTE — Patient Instructions (Signed)
Strengthening: Isometric Flexion  Using wall for resistance, press right fist into ball using light pressure. Hold _5___ seconds. Repeat __5-10__ times per set. Do _1___ sets per session. Do _2___ sessions per day.  Extension (Isometric)  Place left bent elbow and back of arm against wall. Press elbow against wall. Hold __5__ seconds. Repeat _5-10___ times. Do __2__ sessions per day.  Internal Rotation (Isometric)  Place palm of right fist against door frame, with elbow bent. Press fist against door frame. Hold __5__ seconds. Repeat __5-10__ times. Do __2__ sessions per day.  External Rotation (Isometric)  Place back of left fist against door frame, with elbow bent. Press fist against door frame. Hold __5__ seconds. Repeat _5-10___ times. Do __2__ sessions per day.   [facility] [address] [place] [address] [place], [place] [address]  [phone] (office) [phone] (fax)

## 2018-01-15 ENCOUNTER — Encounter: Payer: Self-pay | Admitting: Rehabilitative and Restorative Service Providers"

## 2018-01-17 ENCOUNTER — Encounter: Payer: Self-pay | Admitting: Rehabilitative and Restorative Service Providers"

## 2018-01-17 ENCOUNTER — Ambulatory Visit (INDEPENDENT_AMBULATORY_CARE_PROVIDER_SITE_OTHER): Payer: Self-pay | Admitting: Rehabilitative and Restorative Service Providers"

## 2018-01-17 DIAGNOSIS — M25511 Pain in right shoulder: Secondary | ICD-10-CM

## 2018-01-17 DIAGNOSIS — R29898 Other symptoms and signs involving the musculoskeletal system: Secondary | ICD-10-CM

## 2018-01-17 DIAGNOSIS — R293 Abnormal posture: Secondary | ICD-10-CM

## 2018-01-17 NOTE — Therapy (Signed)
[facility] [place] [address] [place] [address] [place], [place], [address] Phone: [phone]   Fax:  [phone]  Physical Therapy Treatment  Patient Details  Name: Darren Crawford MRN: [id number] Date of Birth: Sep 12, 1985 Referring Provider: Dr Clementeen Graham    Encounter Date: 01/17/2018  PT End of Session - 01/17/18 1404    Visit Number  6    Number of Visits  12    Date for PT Re-Evaluation  02/05/18    PT Start Time  1403    PT Stop Time  1500    PT Time Calculation (min)  57 min    Activity Tolerance  Patient tolerated treatment well       Past Medical History:  Diagnosis Date  . Asthma   . Chronic allergic rhinitis 08/03/2016  . Morbid obesity (HCC) 08/03/2016  . Obstructive sleep apnea syndrome 08/03/2016    History reviewed. No pertinent surgical history.  There were no vitals filed for this visit.  Subjective Assessment - 01/17/18 1406    Subjective  Now calmed down - Now has has pain with certrain motions - more like it was bvefore the flare up. Has pain with reaching up in certain directions; reaching back. Shoulder pain is better than it was. "Back to normal" except those certain motions.     Currently in Pain?  No/denies         [facility] PT Assessment - 01/17/18 0001      Assessment   Medical Diagnosis  Rt rotator cuff tendinitis    Referring Provider  Dr Clementeen Graham     Onset Date/Surgical Date  11/27/17    Hand Dominance  Right    Next MD Visit  01/24/18      AROM   Right Shoulder Extension  67 Degrees    Right Shoulder Flexion  154 Degrees mild pain     Right Shoulder ABduction  150 Degrees pain with lowering     Right Shoulder Internal Rotation  45 Degrees    Right Shoulder External Rotation  95 Degrees                   [facility] Adult PT Treatment/Exercise - 01/17/18 0001      Shoulder Exercises: Supine   Other Supine Exercises  prolonged snow angel 2-3 min - trunk rotation 20-30 sec x 2 each side        Shoulder Exercises: Standing   Extension  Strengthening;Both;10 reps;Theraband    Theraband Level (Shoulder Extension)  Level 2 (Red)    Row  Both;10 reps;Theraband    Theraband Level (Shoulder Row)  Level 2 (Red)    Retraction  Both;12 reps;Theraband    Theraband Level (Shoulder Retraction)  Level 2 (Red)      Shoulder Exercises: Stretch   Other Shoulder Stretches  3 way doorway stretch 30 sec x 2 reps position - high level position painful; had pt stop this and repeat mid-level instead with improved response.  shd flexion hands resting on doorway overhead 20 s x2       Moist Heat Therapy   Number Minutes Moist Heat  20 Minutes    Moist Heat Location  Shoulder Rt      Electrical Stimulation   Electrical Stimulation Location  Rt shoulder     Electrical Stimulation Action  IFC    Electrical Stimulation Parameters  to tolerance    Electrical Stimulation Goals  Pain;Tone      Iontophoresis   Type of  Iontophoresis  Dexamethasone    Location  Rt bicep origin    Dose  1200 mAmp    Time  12 hours       Manual Therapy   Manual therapy comments  pt supine     Soft tissue mobilization  deep tissue work through the anterior shoulder/pecs/biceps     Myofascial Release  anterior chest to anterior shoulder             PT Education - 01/17/18 1432    Education Details  HEP     Person(s) Educated  Patient    Methods  Explanation;Demonstration;Tactile cues;Verbal cues;Handout    Comprehension  Verbalized understanding;Returned demonstration;Verbal cues required;Tactile cues required          PT Long Term Goals - 01/17/18 1417      PT LONG TERM GOAL #1   Title  Improve posture and alignment with patient to demonstrate improved upright posture with posterior shoulder girdle engaged 02/05/18    Time  6    Period  Weeks    Status  On-going      PT LONG TERM GOAL #2   Title  Increased pain free ROM Rt shoulder AROM to equal or greater than Lt shoulder AROM 02/05/18    Time  6     Period  Weeks    Status  On-going      PT LONG TERM GOAL #3   Title  Patient reports 75-80% improvement in Rt shoulder pain with ability to lie on Rt side for 1-2 hours 02/05/18    Time  6    Period  Weeks    Status  On-going      PT LONG TERM GOAL #4   Title  Independent in HEP 02/05/18    Time  6    Period  Weeks    Status  On-going      PT LONG TERM GOAL #5   Title  Improve FOTO to >/= 29% limitation 02/05/18    Time  6    Period  Weeks    Status  On-going            Plan - 01/17/18 1410    Clinical Impression Statement  Calmed down from flare up last week. Now having intermittent pain with elevation and extension(reaching back). ROM is increased. Patient continues to have palpable tightness through the anterior shoulder/chest. He ocntinues to have functional limitations and will benefit from continued treatment to achieve goals.    Clinical Impairments Affecting Rehab Potential  rounded posture; limited UE ROM; sedentary     PT Frequency  2x / week    PT Duration  6 weeks    PT Treatment/Interventions  Patient/family education;ADLs/Self Care Home Management;Electrical Stimulation;Cryotherapy;Iontophoresis 4mg /ml Dexamethasone;Moist Heat;Ultrasound;Dry needling;Manual techniques;Neuromuscular re-education;Therapeutic activities;Therapeutic exercise    PT Next Visit Plan  continue with stretching, posterior shoulder girdle strengthening; manual work; ionto; modalities as indicated     Financial plannerConsulted and Agree with Plan of Care  Patient       Patient will benefit from skilled therapeutic intervention in order to improve the following deficits and impairments:  Postural dysfunction, Improper body mechanics, Pain, Increased fascial restricitons, Increased muscle spasms, Decreased mobility, Decreased range of motion, Decreased activity tolerance  Visit Diagnosis: Acute pain of right shoulder  Other symptoms and signs involving the musculoskeletal system  Abnormal  posture     Problem List Patient Active Problem List   Diagnosis Date Noted  . Tendinitis of right rotator cuff  12/13/2017  . Chronic allergic rhinitis 08/03/2016  . Morbid obesity (HCC) 08/03/2016  . Obstructive sleep apnea syndrome 08/03/2016    Rakiya Krawczyk Rober Minion PT, MPH  01/17/2018, 2:38 PM  [facility] [address] [place] [address] [place], [place], [address] Phone: [phone]   Fax:  [phone]  Name: Darren Crawford MRN: [id number] Date of Birth: Nov 06, 1985

## 2018-01-17 NOTE — Patient Instructions (Addendum)
Arms overhead resting on doorway - gently stepping under door for stretch 15-20 sec 1-2 reps   Flexors Stretch, Standing    Stand near wall and slide arm up, step slowly toward wall. Hold _15-20__ seconds.  Repeat _1-2__ times per session. Do _1-2__ sessions per day.   Knee Roll    Lying on back, with knees bent and feet flat on floor, arms outstretched to sides, slowly roll both knees to side, hold 5 seconds. Back to starting position, hold 20-30 seconds. Then to opposite side, hold 20-30 seconds. Return to starting position. Keep shoulders and arms in contact with floor.  Self massage with plastic ball

## 2018-01-18 ENCOUNTER — Encounter: Payer: Self-pay | Admitting: Rehabilitative and Restorative Service Providers"

## 2018-01-19 ENCOUNTER — Ambulatory Visit (INDEPENDENT_AMBULATORY_CARE_PROVIDER_SITE_OTHER): Payer: Self-pay | Admitting: Physical Therapy

## 2018-01-19 DIAGNOSIS — M25511 Pain in right shoulder: Secondary | ICD-10-CM

## 2018-01-19 DIAGNOSIS — R29898 Other symptoms and signs involving the musculoskeletal system: Secondary | ICD-10-CM

## 2018-01-19 DIAGNOSIS — R293 Abnormal posture: Secondary | ICD-10-CM

## 2018-01-19 NOTE — Therapy (Addendum)
[place] [address] [place], [place], [address] Phone: [phone]   Fax:  [phone]  Physical Therapy Treatment  Patient Details  Name: Darren Crawford MRN: [id number] Date of Birth: [birth year] Referring Provider: Dr. Lynne Leader   Encounter Date: 01/19/2018  PT End of Session - 01/19/18 1340    Visit Number  7    Number of Visits  12    Date for PT Re-Evaluation  02/05/18    PT Start Time  9381    PT Stop Time  1432    PT Time Calculation (min)  57 min    Activity Tolerance  Patient tolerated treatment well    Behavior During Therapy  [facility] for tasks assessed/performed       Past Medical History:  Diagnosis Date  . Asthma   . Chronic allergic rhinitis 08/03/2016  . Morbid obesity ([place]) 08/03/2016  . Obstructive sleep apnea syndrome 08/03/2016    No past surgical history on file.  There were no vitals filed for this visit.  Subjective Assessment - 01/19/18 1340    Subjective  Pt reports his pain is no longer having constant pain in Rt shoulder.  He still has some pain when he is lowering his arm from overhead (up to 7/10), resolves within a few min.  He is trying to avoid painful motions with arm.  He reports he has gotten a rash from the ionto patch, and it has spread across his chest.     Currently in Pain?  No/denies    Pain Score  0-No pain    Pain Orientation  Right         [facility] PT Assessment - 01/19/18 0001      Assessment   Medical Diagnosis  Rt rotator cuff tendinitis    Referring Provider  Dr. Lynne Leader    Onset Date/Surgical Date  11/27/17    Hand Dominance  Right    Next MD Visit  01/24/18    Prior Therapy  none       AROM   Right Shoulder Extension  67 Degrees    Right Shoulder Flexion  154 Degrees   mild pain    Right Shoulder ABduction  150 Degrees   pain with lowering    Right Shoulder Internal Rotation  45 Degrees    Right Shoulder External Rotation  95 Degrees        [facility]  Adult PT Treatment/Exercise - 01/19/18 0001      Shoulder Exercises: Supine   Other Supine Exercises  bilat shoulder ER wiht red band x 10, 2 sets (hooklying on green noodle), trial of bilat shoulder flexion stretch, pt reported "pinching in superior shoulder".     Other Supine Exercises  prolonged snow angel on green noodle 1 min x 2 reps, then active snow angels, to tolerance x 5 slow reps. - trunk rotation 20-30 sec x 2 each side       Shoulder Exercises: ROM/Strengthening   UBE (Upper Arm Bike)  L3: 2 min each direction, standing.       Shoulder Exercises: Stretch   Other Shoulder Stretches  3 way doorway stretch 30 sec x 2 reps position; overhead shoulder flexion stretch x 30 sec  x 2 reps     Other Shoulder Stretches  Rt bicep stretch x 20 x 3 reps      Moist Heat Therapy   Number Minutes Moist Heat  15 Minutes    Moist Heat Location  Shoulder   Rt     Electrical Stimulation   Electrical Stimulation Location  Rt shoulder     Electrical Stimulation Action  IFC    Electrical Stimulation Parameters   to tolerance     Electrical Stimulation Goals  Pain;Tone      Manual Therapy   Soft tissue mobilization  IASTM with Edge tool to Rt lateral / posterior shoulder to decrease fascial restrictions    pt seated    Myofascial Release  anterior chest to anterior shoulder, Rt pec release    pt supine                 PT Long Term Goals - 01/19/18 1359      PT LONG TERM GOAL #1   Title  Improve posture and alignment with patient to demonstrate improved upright posture with posterior shoulder girdle engaged 02/05/18    Time  6    Period  Weeks    Status  On-going      PT LONG TERM GOAL #2   Title  Increased pain free ROM Rt shoulder AROM to equal or greater than Lt shoulder AROM 02/05/18    Time  6    Period  Weeks    Status  Partially Met      PT LONG TERM GOAL #3   Title  Patient reports 75-80% improvement in Rt shoulder pain with ability to lie on Rt side for 1-2 hours  02/05/18    Time  6    Period  Weeks    Status  Partially Met   unable to lie on shoulder without pain     PT LONG TERM GOAL #4   Title  Independent in HEP 02/05/18    Time  6    Period  Weeks    Status  On-going      PT LONG TERM GOAL #5   Title  Improve FOTO to >/= 29% limitation 02/05/18    Time  6    Period  Weeks    Status  On-going            Plan - 01/19/18 1426    Clinical Impression Statement  Pt had reaction to ionto patch over last two visit (last application rash was worse).  Pt's ROM has improved, however he still has some pain with ext with internal rotation and pain when returning to neutral from full flexion.  He has reported 80% reduction in pain since initial eval.  He had some tenderness with IASTM to infraspinatus and teres minor.  Pt has partially met his goals and is progressing well towards remaining.     Rehab Potential  Good    PT Frequency  2x / week    PT Duration  6 weeks    PT Treatment/Interventions  Patient/family education;ADLs/Self Care Home Management;Electrical Stimulation;Cryotherapy;Iontophoresis 85m/ml Dexamethasone;Moist Heat;Ultrasound;Dry needling;Manual techniques;Neuromuscular re-education;Therapeutic activities;Therapeutic exercise    PT Next Visit Plan  continue with stretching, posterior shoulder girdle strengthening; manual work; modalities as indicated     Consulted and Agree with Plan of Care  Patient       Patient will benefit from skilled therapeutic intervention in order to improve the following deficits and impairments:  Postural dysfunction, Improper body mechanics, Pain, Increased fascial restricitons, Increased muscle spasms, Decreased mobility, Decreased range of motion, Decreased activity tolerance  Visit Diagnosis: Acute pain of right shoulder  Other symptoms and signs involving the musculoskeletal system  Abnormal posture     Problem List  Patient Active Problem List   Diagnosis Date Noted  . Tendinitis of  right rotator cuff 12/13/2017  . Chronic allergic rhinitis 08/03/2016  . Morbid obesity ([place]) 08/03/2016  . Obstructive sleep apnea syndrome 08/03/2016   Kerin Perna, PTA 01/19/18 2:48 PM  [facility] [place], [place], [address] Phone: [phone]   Fax:  [phone]  Name: Darren Crawford MRN: [id number] Date of Birth: 1986/05/29  PHYSICAL THERAPY DISCHARGE SUMMARY  Visits from Start of Care: 7  Current functional level related to goals / functional outcomes: See progress note for discharge status   Remaining deficits: Unknown    Education / Equipment: HEP  Plan: Patient agrees to discharge.  Patient goals were met. Patient is being discharged due to meeting the stated rehab goals.  ?????     Celyn P. Helene Kelp PT, MPH 02/21/18 12:06 PM

## 2018-01-24 ENCOUNTER — Ambulatory Visit (INDEPENDENT_AMBULATORY_CARE_PROVIDER_SITE_OTHER): Payer: Self-pay | Admitting: Family Medicine

## 2018-01-24 ENCOUNTER — Encounter: Payer: Self-pay | Admitting: Family Medicine

## 2018-01-24 VITALS — BP 110/72 | HR 61 | Ht 73.0 in | Wt 282.0 lb

## 2018-01-24 DIAGNOSIS — M7581 Other shoulder lesions, right shoulder: Secondary | ICD-10-CM

## 2018-01-24 NOTE — Progress Notes (Signed)
   Darren Crawford is a 32 y.o. male who presents to [facility] today for follow-up right shoulder pain.[name] was seen July 3 for right shoulder pain thought to be rotator cuff tendinopathy.  He said a trial of home exercise program diclofenac gel and formal physical therapy.  He notes considerable symptom improvement but continues to have pain especially with overhead motion reaching back.  He denies radiating pain weakness or numbness fevers or chills.  He continues his home exercise program.    ROS:  As above  Exam:  BP 110/72   Pulse 61   Ht 6\' 1"  (1.854 m)   Wt 282 lb (127.9 kg)   BMI 37.21 kg/m  General: Well Developed, well nourished, and in no acute distress.  Neuro/Psych: Alert and oriented x3, extra-ocular muscles intact, able to move all 4 extremities, sensation grossly intact. Skin: Warm and dry, no rashes noted.  Respiratory: Not using accessory muscles, speaking in full sentences, trachea midline.  Cardiovascular: Pulses palpable, no extremity edema. Abdomen: Does not appear distended. MSK:  Right shoulder normal-appearing no deformity. Normal motion pain with abduction. Mildly positive Hawkins and Neer's test. Intact strength. Pulses capillary fill and sensation are intact distally.     Assessment and Plan: 32 y.o. male with right rotator cuff tendinopathy.  Improved symptoms but not resolved.  After discussion plan for continued home exercise program.  Will recheck in 6 weeks if not significantly sufficiently improved by then we will proceed with either subacromial injection which patient is not excited about or MRI for injection or surgical planning.  Contine diclofenac gel as needed.  I spent 15 minutes with this patient, greater than 50% was face-to-face time counseling regarding ddx and plan.   Historical information moved to improve visibility of documentation.  Past Medical History:  Diagnosis Date  . Asthma     . Chronic allergic rhinitis 08/03/2016  . Morbid obesity (HCC) 08/03/2016  . Obstructive sleep apnea syndrome 08/03/2016   No past surgical history on file. Social History   Tobacco Use  . Smoking status: Never Smoker  . Smokeless tobacco: Never Used  Substance Use Topics  . Alcohol use: Never    Frequency: Never   Family history is unknown by patient.  Medications: Current Outpatient Medications  Medication Sig Dispense Refill  . albuterol (PROVENTIL) (2.5 MG/3ML) 0.083% nebulizer solution Take 2.5 mg by nebulization every 6 (six) hours as needed for wheezing or shortness of breath.    . beclomethasone (QVAR) 40 MCG/ACT inhaler Inhale 1 puff into the lungs daily.    . cetirizine (ZYRTEC) 10 MG chewable tablet Chew 10 mg by mouth daily.    . diclofenac sodium (VOLTAREN) 1 % GEL Apply 2 g topically 4 (four) times daily. To affected joint. 100 g 11  . fluticasone (FLONASE) 50 MCG/ACT nasal spray Place into both nostrils daily.     No current facility-administered medications for this visit.    No Known Allergies    Discussed warning signs or symptoms. Please see discharge instructions. Patient expresses understanding.

## 2018-01-24 NOTE — Patient Instructions (Signed)
Thank you for coming in today. Continue home exercise program on a dedicated basis.  Recheck in 6 weeks.  That will be our decision point on MRI vs Injection or both.  If worsening or having problems or issues before then return sooner contact me.

## 2018-01-27 ENCOUNTER — Other Ambulatory Visit: Payer: Self-pay

## 2018-01-27 ENCOUNTER — Emergency Department (INDEPENDENT_AMBULATORY_CARE_PROVIDER_SITE_OTHER)
Admission: EM | Admit: 2018-01-27 | Discharge: 2018-01-27 | Disposition: A | Discharge status: Home / Self Care | Payer: Self-pay | Source: Home / Self Care | Attending: Family Medicine | Admitting: Family Medicine

## 2018-01-27 ENCOUNTER — Encounter: Payer: Self-pay | Admitting: Emergency Medicine

## 2018-01-27 ENCOUNTER — Emergency Department (INDEPENDENT_AMBULATORY_CARE_PROVIDER_SITE_OTHER): Payer: Self-pay

## 2018-01-27 DIAGNOSIS — M25552 Pain in left hip: Secondary | ICD-10-CM

## 2018-01-27 DIAGNOSIS — S76012A Strain of muscle, fascia and tendon of left hip, initial encounter: Secondary | ICD-10-CM

## 2018-01-27 MED ORDER — HYDROCODONE-ACETAMINOPHEN 5-325 MG PO TABS: 1.0000 | ORAL_TABLET | Freq: Four times a day (QID) | ORAL | 0 refills | Status: DC | PRN

## 2018-01-27 NOTE — ED Triage Notes (Signed)
Patient was running at event and felt pop in left upper leg near groin; now has excruciating pain upon flexion/extenstion of upper leg. Took ibuprofen 600 mg 1045.

## 2018-01-27 NOTE — Discharge Instructions (Addendum)
Apply ice pack for 20 to 30 minutes, 3 to 4 times daily  Continue until pain and swelling decrease.  Use crutches for 5 to 7 days.  May take Aleve, 2 tabs every 12 hours with food.  Begin range of motion and stretching exercises as tolerated.

## 2018-01-27 NOTE — ED Provider Notes (Signed)
[facility]    CSN: [id number] Arrival date & time: 01/27/18  1122     History   Chief Complaint Chief Complaint  Patient presents with  . Leg Pain    left quadricep    HPI Darren Crawford is a 32 y.o. male.   While running in an athletic event this morning about 2 hours ago, patient felt pain in his left upper anterior thigh.  The pain is now excruciating with flexion of his left hip.  The history is provided by the patient.  Leg Pain  Location:  Leg Time since incident:  2 hours Leg location:  L upper leg Pain details:    Quality:  Aching and shooting   Radiates to:  Does not radiate   Severity:  Severe   Onset quality:  Sudden   Duration:  2 hours   Timing:  Constant   Progression:  Worsening Chronicity:  New Prior injury to area:  No Relieved by:  Nothing Worsened by:  Flexion and bearing weight Ineffective treatments:  NSAIDs Associated symptoms: decreased ROM, muscle weakness and stiffness   Associated symptoms: no numbness, no swelling and no tingling     Past Medical History:  Diagnosis Date  . Asthma   . Chronic allergic rhinitis 08/03/2016  . Morbid obesity (HCC) 08/03/2016  . Obstructive sleep apnea syndrome 08/03/2016    Patient Active Problem List   Diagnosis Date Noted  . Tendinitis of right rotator cuff 12/13/2017  . Chronic allergic rhinitis 08/03/2016  . Morbid obesity (HCC) 08/03/2016  . Obstructive sleep apnea syndrome 08/03/2016    History reviewed. No pertinent surgical history.     Home Medications    Prior to Admission medications   Medication Sig Start Date End Date Taking? Authorizing Provider  albuterol (PROVENTIL) (2.5 MG/3ML) 0.083% nebulizer solution Take 2.5 mg by nebulization every 6 (six) hours as needed for wheezing or shortness of breath.    [provider]  beclomethasone (QVAR) 40 MCG/ACT inhaler Inhale 1 puff into the lungs daily.    [provider]  cetirizine (ZYRTEC) 10 MG chewable  tablet Chew 10 mg by mouth daily.    [provider]  diclofenac sodium (VOLTAREN) 1 % GEL Apply 2 g topically 4 (four) times daily. To affected joint. 12/13/17   Rodolph Bongorey, Evan S, MD  fluticasone (FLONASE) 50 MCG/ACT nasal spray Place into both nostrils daily.    [provider]  HYDROcodone-acetaminophen (NORCO/VICODIN) 5-325 MG tablet Take 1 tablet by mouth every 6 (six) hours as needed for moderate pain. 01/27/18   Lattie HawBeese, Laquana Villari A, MD    Family History Family History  Family history unknown: Yes    Social History Social History   Tobacco Use  . Smoking status: Never Smoker  . Smokeless tobacco: Never Used  Substance Use Topics  . Alcohol use: Never    Frequency: Never  . Drug use: Never     Allergies   Ibuprofen   Review of Systems Review of Systems  Musculoskeletal: Positive for stiffness.  All other systems reviewed and are negative.    Physical Exam Triage Vital Signs ED Triage Vitals  Enc Vitals Group     BP 01/27/18 1207 112/76     Pulse Rate 01/27/18 1207 80     Resp 01/27/18 1207 18     Temp 01/27/18 1207 98.1 F (36.7 C)     Temp Source 01/27/18 1207 Oral     SpO2 01/27/18 1207 96 %  Weight 01/27/18 1208 280 lb (127 kg)     Height 01/27/18 1208 6\' 1"  (1.854 m)     Head Circumference --      Peak Flow --      Pain Score 01/27/18 1208 10     Pain Loc --      Pain Edu? --      Excl. in [place]? --    No data found.  Updated Vital Signs BP 112/76 (BP Location: Left Arm)   Pulse 80   Temp 98.1 F (36.7 C) (Oral)   Resp 18   Ht 6\' 1"  (1.854 m)   Wt 127 kg   SpO2 96%   BMI 36.94 kg/m   Visual Acuity Right Eye Distance:   Left Eye Distance:   Bilateral Distance:    Right Eye Near:   Left Eye Near:    Bilateral Near:     Physical Exam  Constitutional: He appears well-developed and well-nourished. No distress.  HENT:  Head: Normocephalic.  Mouth/Throat: Oropharynx is clear and moist.  Eyes: Pupils are equal, round, and  reactive to light.  Neck: Normal range of motion.  Cardiovascular: Normal rate and normal heart sounds.  Pulmonary/Chest: Effort normal and breath sounds normal.  Abdominal: There is no tenderness.  Musculoskeletal: He exhibits no edema.       Left hip: He exhibits decreased range of motion, decreased strength and tenderness. He exhibits no bony tenderness, no swelling, no crepitus and no deformity.       Legs: Patient has distinct tenderness to palpation over the left anterior proximal thigh.  Pain is elicited by resisted flexion of his left hip.  He is unable to actively flex his left hip.  Distal neurovascular function is intact.   Neurological: He is alert.  Skin: Skin is warm and dry.  Nursing note and vitals reviewed.    UC Treatments / Results  Labs (all labs ordered are listed, but only abnormal results are displayed) Labs Reviewed - No data to display  EKG None  Radiology Dg Hip Unilat W Or Wo Pelvis 2-3 Views Left  Result Date: 01/27/2018 CLINICAL DATA:  Pt states he was running this morning and felt a pop in his left hip. He is now unable to bear weight on left leg. C/o anterior pain. EXAM: DG HIP (WITH OR WITHOUT PELVIS) 2-3V LEFT COMPARISON:  None. FINDINGS: AP view pelvis and AP/frog leg views of the left hip. Sacroiliac joints are symmetric. Femoral heads are located. No acute fracture. IMPRESSION: No acute osseous abnormality. Electronically Signed   By: Jeronimo Greaves M.D.   On: 01/27/2018 12:28    Procedures Procedures (including critical care time)  Medications Ordered in UC Medications - No data to display  Initial Impression / Assessment and Plan / UC Course  I have reviewed the triage vital signs and the nursing notes.  Pertinent labs & imaging results that were available during my care of the patient were reviewed by me and considered in my medical decision making (see chart for details).    Rx for Lortab (#12, no refill). Controlled Substance  Prescriptions I have consulted the [place] Controlled Substances Registry for this patient, and feel the risk/benefit ratio today is favorable for proceeding with this prescription for a controlled substance.     Patient notes that he has crutches at home. Followup with Dr. Rodney Langton or Dr. Clementeen Graham ([facility]) if not improving about two weeks.    Final Clinical Impressions(s) /  UC Diagnoses   Final diagnoses:  Strain of hip flexor, left, initial encounter     Discharge Instructions     Apply ice pack for 20 to 30 minutes, 3 to 4 times daily  Continue until pain and swelling decrease.  Use crutches for 5 to 7 days.  May take Aleve, 2 tabs every 12 hours with food.  Begin range of motion and stretching exercises as tolerated.       ED Prescriptions    Medication Sig Dispense Auth. Provider   HYDROcodone-acetaminophen (NORCO/VICODIN) 5-325 MG tablet Take 1 tablet by mouth every 6 (six) hours as needed for moderate pain. 12 tablet Lattie HawBeese, Carizma Dunsworth A, MD        Lattie HawBeese, Lenna Hagarty A, MD 01/31/18 [id number]

## 2018-03-07 ENCOUNTER — Ambulatory Visit: Payer: Self-pay | Admitting: Family Medicine

## 2019-03-10 ENCOUNTER — Other Ambulatory Visit: Payer: Self-pay

## 2019-03-10 ENCOUNTER — Emergency Department
Admission: EM | Admit: 2019-03-10 | Discharge: 2019-03-10 | Disposition: A | Discharge status: Home / Self Care | Payer: Managed Care, Other (non HMO) | Source: Home / Self Care | Attending: Family Medicine | Admitting: Family Medicine

## 2019-03-10 ENCOUNTER — Emergency Department (INDEPENDENT_AMBULATORY_CARE_PROVIDER_SITE_OTHER): Payer: Managed Care, Other (non HMO)

## 2019-03-10 DIAGNOSIS — M25561 Pain in right knee: Secondary | ICD-10-CM | POA: Diagnosis not present

## 2019-03-10 LAB — URIC ACID: Uric Acid, Serum: 5.5 mg/dL (ref 4.0–8.0)

## 2019-03-10 MED ORDER — HYDROCODONE-ACETAMINOPHEN 5-325 MG PO TABS: ORAL_TABLET | ORAL | 0 refills | Status: DC

## 2019-03-10 MED ORDER — PREDNISONE 20 MG PO TABS: ORAL_TABLET | ORAL | 0 refills | Status: DC

## 2019-03-10 MED ORDER — ACETAMINOPHEN 325 MG PO TABS
975.0000 mg | ORAL_TABLET | Freq: Once | ORAL | Status: AC
  Administered 2019-03-10: 975 mg via ORAL

## 2019-03-10 NOTE — ED Provider Notes (Signed)
[facility]    CSN: [id number] Arrival date & time: 03/10/19  1032      History   Chief Complaint Chief Complaint  Patient presents with  . Knee Pain    RT    HPI Darren Crawford is a 33 y.o. male.   Patient complains of onset of right knee pain yesterday after walking at a science center.  The pain gradually increased later in the day, and he had difficulty sleeping as a result.  He developed increased swelling and tightness in the knee last night, and reports that his knee has felt warm.  The pain is worse with any movement, and he has been using crutches and applying ice packs.  He denies any recent knee injury.  He denies past history of gout or family history of gout.  The history is provided by the patient.  Knee Pain Location:  Knee Time since incident:  1 day Injury: no   Knee location:  R knee Pain details:    Quality:  Aching   Radiates to: posterior leg.   Severity:  Severe   Onset quality:  Gradual   Timing:  Constant   Progression:  Worsening Chronicity:  New Prior injury to area:  No Relieved by:  Nothing Worsened by:  Bearing weight, extension and flexion Ineffective treatments:  NSAIDs and ice Associated symptoms: decreased ROM, stiffness and swelling   Associated symptoms: no fever, no muscle weakness, no numbness and no tingling     Past Medical History:  Diagnosis Date  . Asthma   . Chronic allergic rhinitis 08/03/2016  . Morbid obesity (HCC) 08/03/2016  . Obstructive sleep apnea syndrome 08/03/2016    Patient Active Problem List   Diagnosis Date Noted  . Tendinitis of right rotator cuff 12/13/2017  . Chronic allergic rhinitis 08/03/2016  . Morbid obesity (HCC) 08/03/2016  . Obstructive sleep apnea syndrome 08/03/2016    History reviewed. No pertinent surgical history.     Home Medications    Prior to Admission medications   Medication Sig Start Date End Date Taking? Authorizing Provider  albuterol (PROVENTIL) (2.5 MG/3ML)  0.083% nebulizer solution Take 2.5 mg by nebulization every 6 (six) hours as needed for wheezing or shortness of breath.    [provider]  beclomethasone (QVAR) 40 MCG/ACT inhaler Inhale 1 puff into the lungs daily.    [provider]  cetirizine (ZYRTEC) 10 MG chewable tablet Chew 10 mg by mouth daily.    [provider]  diclofenac sodium (VOLTAREN) 1 % GEL Apply 2 g topically 4 (four) times daily. To affected joint. 12/13/17   Rodolph Bong, MD  fluticasone (FLONASE) 50 MCG/ACT nasal spray Place into both nostrils daily.    [provider]  HYDROcodone-acetaminophen (NORCO/VICODIN) 5-325 MG tablet Take one by mouth at bedtime as needed for pain 03/10/19   Lattie Haw, MD  predniSONE (DELTASONE) 20 MG tablet Take one tab by mouth twice daily for 4 days, then one daily. Take with food. 03/10/19   Lattie Haw, MD    Family History Family History  Family history unknown: Yes    Social History Social History   Tobacco Use  . Smoking status: Never Smoker  . Smokeless tobacco: Never Used  Substance Use Topics  . Alcohol use: Never    Frequency: Never  . Drug use: Never     Allergies   Penicillins and Ibuprofen   Review of Systems Review of Systems  Constitutional: Negative for fever.  Musculoskeletal: Positive for joint swelling and stiffness.  Skin: Negative for color change.  All other systems reviewed and are negative.    Physical Exam Triage Vital Signs ED Triage Vitals  Enc Vitals Group     BP 03/10/19 1104 125/78     Pulse Rate 03/10/19 1104 86     Resp 03/10/19 1104 18     Temp 03/10/19 1104 97.9 F (36.6 C)     Temp Source 03/10/19 1104 Oral     SpO2 03/10/19 1104 98 %     Weight --      Height --      Head Circumference --      Peak Flow --      Pain Score 03/10/19 1105 9     Pain Loc --      Pain Edu? --      Excl. in [place]? --    No data found.  Updated Vital Signs BP 125/78 (BP Location: Right Arm)    Pulse 86   Temp 97.9 F (36.6 C) (Oral)   Resp 18   SpO2 98%   Visual Acuity Right Eye Distance:   Left Eye Distance:   Bilateral Distance:    Right Eye Near:   Left Eye Near:    Bilateral Near:     Physical Exam Vitals signs and nursing note reviewed.  Constitutional:      General: He is not in acute distress. HENT:     Head: Atraumatic.  Eyes:     Pupils: Pupils are equal, round, and reactive to light.  Cardiovascular:     Rate and Rhythm: Normal rate.  Pulmonary:     Effort: Pulmonary effort is normal.  Musculoskeletal:     Right knee: He exhibits decreased range of motion. He exhibits no effusion, no ecchymosis, no deformity, no erythema and no LCL laxity. Tenderness found. Medial joint line, lateral joint line, MCL and LCL tenderness noted.     Comments: Right knee:  No effusion, erythema, or warmth.  Patient has difficulty actively/passively fully extending knee, and has difficultly flexing more than 90 degrees. Knee is diffusely tender to palpation, especially medially and laterally.  Knee stable, negative drawer test.  Unable to adequately perform McMurray test.     Skin:    General: Skin is warm and dry.  Neurological:     Mental Status: He is alert.      UC Treatments / Results  Labs (all labs ordered are listed, but only abnormal results are displayed) Labs Reviewed  URIC ACID    EKG   Radiology Dg Knee Complete 4 Views Right  Result Date: 03/10/2019 CLINICAL DATA:  Acute right knee pain without known injury. EXAM: RIGHT KNEE - COMPLETE 4+ VIEW COMPARISON:  None. FINDINGS: No evidence of fracture, dislocation, or joint effusion. No evidence of arthropathy or other focal bone abnormality. Soft tissues are unremarkable. IMPRESSION: Negative. Electronically Signed   By: Marijo Conception M.D.   On: 03/10/2019 13:10    Procedures Procedures (including critical care time)  Medications Ordered in UC Medications  acetaminophen (TYLENOL) tablet 975 mg (975  mg Oral Given 03/10/19 1107)    Initial Impression / Assessment and Plan / UC Course  I have reviewed the triage vital signs and the nursing notes.  Pertinent labs & imaging results that were available during my care of the patient were reviewed by me and considered in my medical decision making (see chart for details).    Negative knee  x-ray reassuring.  No history of injury. ?Gout; uric acid pending.  Ace wrap applied. Begin prednisone burst/taper.  Rx for Lortab at bedtime (#5, no refill). Controlled Substance Prescriptions I have consulted the [place] Controlled Substances Registry for this patient, and feel the risk/benefit ratio today is favorable for proceeding with this prescription for a controlled substance.   Followup with Dr. Rodney Langtonhomas Thekkekandam or Dr. Clementeen GrahamEvan Corey ([facility]) if not improving about two weeks.    Final Clinical Impressions(s) / UC Diagnoses   Final diagnoses:  Acute pain of right knee     Discharge Instructions     Increase fluid intake. Apply ice pack for 30 minutes every 1 to 2 hours today and tomorrow.  Elevate.  Use crutches for 3 to 5 days.  Wear Ace wrap until swelling decreases, then if needed, wear velcro brace for about 2 to 3 weeks.       ED Prescriptions    Medication Sig Dispense Auth. Provider   predniSONE (DELTASONE) 20 MG tablet Take one tab by mouth twice daily for 4 days, then one daily. Take with food. 12 tablet Lattie HawBeese, Ladarrell Cornwall A, MD   HYDROcodone-acetaminophen (NORCO/VICODIN) 5-325 MG tablet Take one by mouth at bedtime as needed for pain 5 tablet Lattie HawBeese, Anastazia Creek A, MD        Lattie HawBeese, Alec Jaros A, MD 03/11/19 [phone]

## 2019-03-10 NOTE — ED Triage Notes (Signed)
Pt c/o RT knee pain that started randomly yesterday while walking around the science center. Denies any known injury. Pain mostly on sides of knee and radiate behind his leg. Pain 9/10. Taking advil prn.

## 2019-03-10 NOTE — Discharge Instructions (Addendum)
Increase fluid intake. Apply ice pack for 30 minutes every 1 to 2 hours today and tomorrow.  Elevate.  Use crutches for 3 to 5 days.  Wear Ace wrap until swelling decreases, then if needed, wear velcro brace for about 2 to 3 weeks.

## 2019-03-10 NOTE — ED Notes (Signed)
Pt given ice pack for knee.

## 2019-03-11 ENCOUNTER — Ambulatory Visit: Payer: Managed Care, Other (non HMO) | Admitting: Family Medicine

## 2019-03-11 ENCOUNTER — Other Ambulatory Visit: Payer: Self-pay

## 2019-03-11 ENCOUNTER — Encounter: Payer: Self-pay | Admitting: Family Medicine

## 2019-03-11 VITALS — BP 135/78 | HR 84 | Temp 97.4°F | Wt 265.0 lb

## 2019-03-11 DIAGNOSIS — M25561 Pain in right knee: Secondary | ICD-10-CM

## 2019-03-11 NOTE — Progress Notes (Signed)
Darren Crawford is a 33 y.o. male who presents to [facility] today for knee pain. Patient reports that on Saturday afternoon, he was sitting on the couch with his right leg extended. His daughter than jumped on his right knee landing on the anterior lateral aspect. He reports his knee was initially sore and later that day he felt a pop as he twisted his knee. The pain worsened throughout the day and eventually woke him up Sunday around 2AM. He presented to Urgent Care where XR was performed that showed no evidence of fracture, dislocation, or effusion. Labs performed to evaluate gout showed uric acid was WNL. He was given prednisone and Vicodin for the pain. He reports swelling and difficulty extending the knee. The pain has been constant since Saturday and reports sharp, throbbing pain on the lateral, medial, and posterior aspects of the knee. He denies any history of knee or other joint pain. He does note grinding and catching.  He feels some instability with ambulation.  ROS:  As above  Exam:  BP 135/78   Pulse 84   Temp (!) 97.4 F (36.3 C) (Oral)   Wt 265 lb (120.2 kg)   BMI 34.96 kg/m  Wt Readings from Last 5 Encounters:  03/11/19 265 lb (120.2 kg)  01/27/18 280 lb (127 kg)  01/24/18 282 lb (127.9 kg)  12/13/17 283 lb (128.4 kg)  12/02/17 282 lb (127.9 kg)   General: Well Developed, well nourished, and in no acute distress.  Neuro/Psych: Alert and oriented x3, extra-ocular muscles intact, able to move all 4 extremities, sensation grossly intact. Skin: Warm and dry, no rashes noted.  Respiratory: Not using accessory muscles, speaking in full sentences, trachea midline.  Cardiovascular: Pulses palpable, no extremity edema. Abdomen: Does not appear distended. MSK:   Right knee relatively normal-appearing with mild effusion.  No erythema. Range of motion 5-100 degrees with crepitation Tender palpation lateral joint line. Guarding during  exam however no definitive laxity with anterior posterior drawer testing.  No laxity however pain with LCL stress test.  No laxity or pain with MCL stress test. Guarding with McMurray's testing nondiagnostic. Extension strength diminished with guarding and pain 4/5.  Flexion strength diminished with pain 4/5. Significant antalgic gait.     Lab and Radiology Results Results for orders placed or performed during the hospital encounter of 03/10/19 (from the past 72 hour(s))  Uric acid     Status: None   Collection Time: 03/10/19  1:40 PM  Result Value Ref Range   Uric Acid, Serum 5.5 4.0 - 8.0 mg/dL    Comment: Therapeutic target for gout patients: <6.0 mg/dL .    Dg Knee Complete 4 Views Right  Result Date: 03/10/2019 CLINICAL DATA:  Acute right knee pain without known injury. EXAM: RIGHT KNEE - COMPLETE 4+ VIEW COMPARISON:  None. FINDINGS: No evidence of fracture, dislocation, or joint effusion. No evidence of arthropathy or other focal bone abnormality. Soft tissues are unremarkable. IMPRESSION: Negative. Electronically Signed   By: Lupita Raider M.D.   On: 03/10/2019 13:10   I personally (independently) visualized and performed the interpretation of the images attached in this note.     Assessment and Plan: 33 y.o. male with right knee pain.  Given his injury history mild effusion and mechanical symptoms concern for ligament injury.  Plan for MRI to evaluate possible meniscal tear. Advised patient to continue RICE treatment, ibuprofen, and immobilization as needed. Plan to follow up after MRI.  PDMP not reviewed this encounter. Orders Placed This Encounter  Procedures  . MR Knee Right Wo Contrast    Standing Status:   Future    Standing Expiration Date:   05/10/2020    Order Specific Question:   What is the patient's sedation requirement?    Answer:   No Sedation    Order Specific Question:   Does the patient have a pacemaker or implanted devices?    Answer:   No    Order  Specific Question:   Preferred imaging location?    Answer:   [facility] development [facility] (table limit-350lbs)    Order Specific Question:   Radiology Contrast Protocol - do NOT remove file path    Answer:   \\[email or web address]   No orders of the defined types were placed in this encounter.   Historical information moved to improve visibility of documentation.  Past Medical History:  Diagnosis Date  . Asthma   . Chronic allergic rhinitis 08/03/2016  . Morbid obesity ([place]) 08/03/2016  . Obstructive sleep apnea syndrome 08/03/2016   No past surgical history on file. Social History   Tobacco Use  . Smoking status: Never Smoker  . Smokeless tobacco: Never Used  Substance Use Topics  . Alcohol use: Never    Frequency: Never   Family history is unknown by patient.  Medications: Current Outpatient Medications  Medication Sig Dispense Refill  . albuterol (PROVENTIL) (2.5 MG/3ML) 0.083% nebulizer solution Take 2.5 mg by nebulization every 6 (six) hours as needed for wheezing or shortness of breath.    . beclomethasone (QVAR) 40 MCG/ACT inhaler Inhale 1 puff into the lungs daily.    . cetirizine (ZYRTEC) 10 MG chewable tablet Chew 10 mg by mouth daily.    . diclofenac sodium (VOLTAREN) 1 % GEL Apply 2 g topically 4 (four) times daily. To affected joint. 100 g 11  . fluticasone (FLONASE) 50 MCG/ACT nasal spray Place into both nostrils daily.    Marland Kitchen HYDROcodone-acetaminophen (NORCO/VICODIN) 5-325 MG tablet Take one by mouth at bedtime as needed for pain 5 tablet 0  . predniSONE (DELTASONE) 20 MG tablet Take one tab by mouth twice daily for 4 days, then one daily. Take with food. 12 tablet 0   No current facility-administered medications for this visit.    Allergies  Allergen Reactions  . Penicillins   . Ibuprofen Rash      Discussed warning signs or symptoms. Please see discharge instructions. Patient expresses understanding.   I personally was present and  performed or re-performed the history, physical exam and medical decision-making activities of this service and have verified that the service and findings are accurately documented in the student's note. ___________________________________________ Lynne Leader M.D., ABFM., CAQSM. Primary Care and Sports Medicine Adjunct Instructor of [employer] [facility] [employer]

## 2019-03-11 NOTE — Patient Instructions (Signed)
Thank you for coming in today. We will proceed to MRI.  I will get RESULTS ASAP.     Meniscus Tear  A meniscus tear is a knee injury that happens when a piece of the meniscus is torn. The meniscus is a thick, rubbery, wedge-shaped cartilage in the knee. Two menisci are located in each knee. They sit between the upper bone (femur) and lower bone (tibia) that make up the knee joint. Each meniscus acts as a shock absorber for the knee. A torn meniscus is one of the most common types of knee injuries. This injury can range from mild to severe. Surgery may be needed to repair a severe tear. What are the causes? This condition may be caused by any kneeling, squatting, twisting, or pivoting movement. Sports-related injuries are the most common cause. These often occur from:  Running and stopping suddenly. ? Changing direction. ? Being tackled or knocked off your feet.  Lifting or carrying heavy weights. As people get older, their menisci get thinner and weaker. In these people, tears can happen more easily, such as from climbing stairs. What increases the risk? You are more likely to develop this condition if you:  Play contact sports.  Have a job that requires kneeling or squatting.  Are male.  Are over 71 years old. What are the signs or symptoms? Symptoms of this condition include:  Knee pain, especially at the side of the knee joint. You may feel pain when the injury occurs, or you may only hear a pop and feel pain later.  A feeling that your knee is clicking, catching, locking, or giving way.  Not being able to fully bend or extend your knee.  Bruising or swelling in your knee. How is this diagnosed? This condition may be diagnosed based on your symptoms and a physical exam. You may also have tests, such as:  X-rays.  MRI.  A procedure to look inside your knee with a narrow surgical telescope (arthroscopy). You may be referred to a knee specialist (orthopedic surgeon).  How is this treated? Treatment for this injury depends on the severity of the tear. Treatment for a mild tear may include:  Rest.  Medicine to reduce pain and swelling. This is usually a nonsteroidal anti-inflammatory drug (NSAID), like ibuprofen.  A knee brace, sleeve, or wrap.  Using crutches or a walker to keep weight off your knee and to help you walk.  Exercises to strengthen your knee (physical therapy). You may need surgery if you have a severe tear or if other treatments are not working. Follow these instructions at home: If you have a brace, sleeve, or wrap:  Wear it as told by your health care provider. Remove it only as told by your health care provider.  Loosen the brace, sleeve, or wrap if your toes tingle, become numb, or turn cold and blue.  Keep the brace, sleeve, or wrap clean and dry.  If the brace, sleeve, or wrap is not waterproof: ? Do not let it get wet. ? Cover it with a watertight covering when you take a bath or shower. Managing pain and swelling   Take over-the-counter and prescription medicines only as told by your health care provider.  If directed, put ice on your knee: ? If you have a removable brace, sleeve, or wrap, remove it as told by your health care provider. ? Put ice in a plastic bag. ? Place a towel between your skin and the bag. ? Leave the ice on  for 20 minutes, 2-3 times per day.  Move your toes often to avoid stiffness and to lessen swelling.  Raise (elevate) the injured area above the level of your heart while you are sitting or lying down. Activity  Do not use the injured limb to support your body weight until your health care provider says that you can. Use crutches or a walker as told by your health care provider.  Return to your normal activities as told by your health care provider. Ask your health care provider what activities are safe for you.  Perform range-of-motion exercises only as told by your health care provider.   Begin doing exercises to strengthen your knee and leg muscles only as told by your health care provider. After you recover, your health care provider may recommend these exercises to help prevent another injury. General instructions  Use a knee brace, sleeve, or wrap as told by your health care provider.  Ask your health care provider when it is safe to drive if you have a brace, sleeve, or wrap on your knee.  Do not use any products that contain nicotine or tobacco, such as cigarettes, e-cigarettes, and chewing tobacco. If you need help quitting, ask your health care provider.  Ask your health care provider if the medicine prescribed to you: ? Requires you to avoid driving or using heavy machinery. ? Can cause constipation. You may need to take these actions to prevent or treat constipation:  Drink enough fluid to keep your urine pale yellow.  Take over-the-counter or prescription medicines.  Eat foods that are high in fiber, such as beans, whole grains, and fresh fruits and vegetables.  Limit foods that are high in fat and processed sugars, such as fried or sweet foods.  Keep all follow-up visits as told by your health care provider. This is important. Contact a health care provider if:  You have a fever.  Your knee becomes red, tender, or swollen.  Your pain medicine is not helping.  Your symptoms get worse or do not improve after 2 weeks of home care. Summary  A meniscus tear is a knee injury that happens when a piece of the meniscus is torn.  Treatment for this injury depends on the severity of the tear. You may need surgery if you have a severe tear or if other treatments are not working.  Rest, ice, and raise (elevate) your injured knee as told by your health care provider. This will help lessen pain and swelling.  Contact a health care provider if you have new symptoms, or your symptoms get worse or do not improve after 2 weeks of home care.  Keep all follow-up  visits as told by your health care provider. This is important. This information is not intended to replace advice given to you by your health care provider. Make sure you discuss any questions you have with your health care provider. Document Released: 08/20/2002 Document Revised: 12/12/2017 Document Reviewed: 12/12/2017 Elsevier Patient Education  2020 [employer].

## 2019-03-12 ENCOUNTER — Telehealth: Payer: Self-pay | Admitting: Family Medicine

## 2019-03-12 MED ORDER — OXYCODONE-ACETAMINOPHEN 5-325 MG PO TABS: 1.0000 | ORAL_TABLET | Freq: Three times a day (TID) | ORAL | 0 refills | Status: DC | PRN

## 2019-03-12 MED ORDER — CVS TUSSIN DM CLEAR PO
8.00 | ORAL | Status: DC
Start: ? — End: 2019-03-12

## 2019-03-12 NOTE — Telephone Encounter (Signed)
Pt's wife called to see what Pt can do for pain while they wait for the MRI. Case was submitted to insurance and is pending nurse review. He has 2 tabs of hydrocodone left. Taking prednisone and wearing immobilizer too.   They did take him to [facility] ED last night because the pain was so bad. He got morphine and "finally got relief."  Routing.

## 2019-03-12 NOTE — Telephone Encounter (Signed)
Sent oxycodone to [facility] in [place]

## 2019-03-12 NOTE — Telephone Encounter (Signed)
Pt's wife advised.

## 2019-03-15 ENCOUNTER — Telehealth: Payer: Self-pay | Admitting: Family Medicine

## 2019-03-15 NOTE — Telephone Encounter (Signed)
Patients wife called to find out results of the MRI of the right knee. She was told by someone else that the results were sent to [place], but when looking into the chart the MRI was still in "ordered" status as of 4:00 pm 03/15/19 . She was worried about it getting worse over the weekend.

## 2019-03-15 NOTE — Telephone Encounter (Signed)
Called patient and wife answered the phone. I advised patient's wife, Darren Crawford, that we do not yet have results. Darren Crawford states patient is still in a lot of pain.   I have patient scheduled for follow up with Dr Georgina Snell on Monday at 1:30. I also advised Darren Crawford to have patient rest over the weekend and call provider on call or go to ER if worsening of SX.   Katie understandable and agreeable to plan.

## 2019-03-18 ENCOUNTER — Encounter: Payer: Self-pay | Admitting: Family Medicine

## 2019-03-18 ENCOUNTER — Other Ambulatory Visit: Payer: Self-pay

## 2019-03-18 ENCOUNTER — Ambulatory Visit: Payer: Managed Care, Other (non HMO)

## 2019-03-18 ENCOUNTER — Telehealth: Payer: Self-pay | Admitting: Family Medicine

## 2019-03-18 ENCOUNTER — Ambulatory Visit (INDEPENDENT_AMBULATORY_CARE_PROVIDER_SITE_OTHER): Payer: Managed Care, Other (non HMO) | Admitting: Family Medicine

## 2019-03-18 VITALS — BP 125/69 | HR 99 | Wt 270.0 lb

## 2019-03-18 DIAGNOSIS — S86111A Strain of other muscle(s) and tendon(s) of posterior muscle group at lower leg level, right leg, initial encounter: Secondary | ICD-10-CM

## 2019-03-18 DIAGNOSIS — M79604 Pain in right leg: Secondary | ICD-10-CM | POA: Diagnosis not present

## 2019-03-18 DIAGNOSIS — M25561 Pain in right knee: Secondary | ICD-10-CM

## 2019-03-18 NOTE — Telephone Encounter (Signed)
Pt notified of results -EH/RMA   

## 2019-03-18 NOTE — Patient Instructions (Signed)
Thank you for coming in today. Attend PT.  Get ultrasound to evaluative for DVT You should hear about 2nd opinion and surgical consolation for tear.  Use the brace and crutches as needed.  Ok to advance weight bearing and range of motion as able.

## 2019-03-18 NOTE — Telephone Encounter (Signed)
MRI report from [facility] shows moderate strain of the lateral head of the gastrocnemius (calf muscle) with low-grade partial tear of the femoral attachment.  This is good news because it will get better without surgery almost certainly.  Additionally no meniscus tear visible.  No ligament tear.  Return to clinic for recheck reevaluation.  If you have not please bring with you the images on CD.

## 2019-03-18 NOTE — Progress Notes (Signed)
Darren Crawford is a 33 y.o. male who presents to [facility] today for right knee pain follow-up.  Patient was seen on September 28 for right posterior knee pain.  He had a injury where his knee was extended forcefully by a child.  He had significant lateral and posterior lateral knee pain and ultimately MRI was obtained yesterday which showed strain and partial tear at the proximal lateral head of gastrocnemius insertion.  Otherwise MRI is largely unremarkable.  However he has had significant pain in this area requiring emergency room trips.  He was ultimately treated with a knee immobilizer and crutches.  He is unable to bear weight due to pain and unable to have his knee flexed for any extended period of time due to pain.  He denies any pain radiating into his foot or foot drop.  He denies any numbness into his lower leg.    ROS:  As above  Exam:  BP 125/69    Pulse 99    Wt 270 lb (122.5 kg)    BMI 35.62 kg/m  Wt Readings from Last 5 Encounters:  03/18/19 270 lb (122.5 kg)  03/11/19 265 lb (120.2 kg)  01/27/18 280 lb (127 kg)  01/24/18 282 lb (127.9 kg)  12/13/17 283 lb (128.4 kg)   General: Well Developed, well nourished, and in no acute distress.  Neuro/Psych: Alert and oriented x3, extra-ocular muscles intact, able to move all 4 extremities, sensation grossly intact. Skin: Warm and dry, no rashes noted.  Respiratory: Not using accessory muscles, speaking in full sentences, trachea midline.  Cardiovascular: Pulses palpable, no extremity edema. Abdomen: Does not appear distended. MSK: Right knee largely normal-appearing without significant effusion or deformity. Tender palpation lateral joint line and posterior lateral corner. Range of motion significantly limited 5-90 degrees. Patient guards with exam testing including LCL testing. Foot plantar and dorsiflexion is intact to strength testing. Pulses cap refill and sensation are intact  to the foot distally.    Lab and Radiology Results No results found for this or any previous visit (from the past 72 hour(s)). US Venous Img Lower Unilateral Right  Result Date: 03/18/2019 CLINICAL DATA:  Lower extremity swelling EXAM: RIGHT LOWER EXTREMITY VENOUS DOPPLER ULTRASOUND TECHNIQUE: Gray-scale sonography with graded compression, as well as color Doppler and duplex ultrasound were performed to evaluate the lower extremity deep venous systems from the level of the common femoral vein and including the common femoral, femoral, profunda femoral, popliteal and calf veins including the posterior tibial, peroneal and gastrocnemius veins when visible. The superficial great saphenous vein was also interrogated. Spectral Doppler was utilized to evaluate flow at rest and with distal augmentation maneuvers in the common femoral, femoral and popliteal veins. COMPARISON:  None. FINDINGS: Contralateral Common Femoral Vein: Respiratory phasicity is normal and symmetric with the symptomatic side. No evidence of thrombus. Normal compressibility. Common Femoral Vein: No evidence of thrombus. Normal compressibility, respiratory phasicity and response to augmentation. Saphenofemoral Junction: No evidence of thrombus. Normal compressibility and flow on color Doppler imaging. Profunda Femoral Vein: No evidence of thrombus. Normal compressibility and flow on color Doppler imaging. Femoral Vein: No evidence of thrombus. Normal compressibility, respiratory phasicity and response to augmentation. Popliteal Vein: No evidence of thrombus. Normal compressibility, respiratory phasicity and response to augmentation. Calf Veins: No evidence of thrombus. Normal compressibility and flow on color Doppler imaging. Superficial Great Saphenous Vein: No evidence of thrombus. Normal compressibility. Venous Reflux:  Not performed. Other Findings:  None. IMPRESSION: No  evidence of deep venous thrombosis. Electronically Signed   By:  Constance Holster M.D.   On: 03/18/2019 15:14    MR KNEE RIGHT WO CONTRAST10/07/2018 [facility] Result Impression   1.  Moderate strain of the lateral head gastrocnemius with low-grade partial tear at the femoral attachment. 2.  No meniscal tear. 3.  Intermediate grade patellofemoral compartment chondrosis.  Result Narrative  MRI RIGHT KNEE WITHOUT CONTRAST, 03/14/2019 11:12 PM  INDICATION:  \ M25.561 Acute pain of right knee  COMPARISON: Right knee radiographs 03/14/2019  TECHNIQUE: Multi-planar, multi-sequence MR imaging of the right knee was performed without contrast.  FINDINGS:  .  Bone: No fracture, avascular necrosis, or marrow replacement. .  Cruciate ligaments: Intact. .  Collateral ligaments: Intact. .  Medial meniscus: Intact. .  Lateral meniscus: Intact. .  Medial compartment: No deep chondral defect. .  Lateral compartment: No deep chondral defect. .  Patellofemoral compartment: Intermediate grade chondrosis involving the median ridge, lateral patellar facet, and lateral trochlea. .  Extensor mechanism: Intact. .  Joint: Small joint effusion with synovitis. .  Soft tissues: Moderate strain of the       Assessment and Plan: 33 y.o. male with  Right knee pain: Likely secondary to the gastrocnemius insertion attachment injury seen on MRI however patient has considerable pain more than I would expect for this injury.  This is a relatively rare injury to have in isolation.  Often there is LCL tear or meniscus tear or other structural injuries with gastrocnemius strain/tear. Plan for lockout hinged knee brace, advance weightbearing as tolerated.  Will obtain vascular duplex ultrasound to evaluate for DVT.  Plan to proceed with referral to orthopedic surgery for recheck second opinion and evaluate for possible surgical treatment of tendon tear.  If no benefit may proceed with vascular imaging of the arterial system of the posterior knee although this  is less likely given his presence of pulses into his feet.  Recheck as needed in the near future.   PDMP not reviewed this encounter. Orders Placed This Encounter  Procedures   US Venous Img Lower Unilateral Right    Standing Status:   Future    Number of Occurrences:   1    Standing Expiration Date:   05/17/2020    Order Specific Question:   Reason for Exam (SYMPTOM  OR DIAGNOSIS REQUIRED)    Answer:   Right knee injury. Immibility eval pss DVT    Order Specific Question:   Preferred imaging location?    Answer:   [facility]   Ambulatory referral to Orthopedic Surgery    Referral Priority:   Routine    Referral Type:   Surgical    Referral Reason:   Specialty Services Required    Requested Specialty:   Orthopedic Surgery    Number of Visits Requested:   1   Ambulatory referral to Physical Therapy    Referral Priority:   Routine    Referral Type:   Physical Medicine    Referral Reason:   Specialty Services Required    Requested Specialty:   Physical Therapy   No orders of the defined types were placed in this encounter.   Historical information moved to improve visibility of documentation.  Past Medical History:  Diagnosis Date   Asthma    Chronic allergic rhinitis 08/03/2016   Morbid obesity ([place]) 08/03/2016   Obstructive sleep apnea syndrome 08/03/2016   No past surgical history on file. Social History   Tobacco  Use   Smoking status: Never Smoker   Smokeless tobacco: Never Used  Substance Use Topics   Alcohol use: Never    Frequency: Never   Family history is unknown by patient.  Medications: Current Outpatient Medications  Medication Sig Dispense Refill   albuterol (PROVENTIL) (2.5 MG/3ML) 0.083% nebulizer solution Take 2.5 mg by nebulization every 6 (six) hours as needed for wheezing or shortness of breath.     beclomethasone (QVAR) 40 MCG/ACT inhaler Inhale 1 puff into the lungs daily.     cetirizine (ZYRTEC) 10 MG chewable tablet Chew  10 mg by mouth daily.     diclofenac sodium (VOLTAREN) 1 % GEL Apply 2 g topically 4 (four) times daily. To affected joint. 100 g 11   fluticasone (FLONASE) 50 MCG/ACT nasal spray Place into both nostrils daily.     HYDROcodone-acetaminophen (NORCO/VICODIN) 5-325 MG tablet Take one by mouth at bedtime as needed for pain 5 tablet 0   oxyCODONE-acetaminophen (PERCOCET/ROXICET) 5-325 MG tablet Take 1 tablet by mouth every 8 (eight) hours as needed. 15 tablet 0   predniSONE (DELTASONE) 20 MG tablet Take one tab by mouth twice daily for 4 days, then one daily. Take with food. 12 tablet 0   No current facility-administered medications for this visit.    Allergies  Allergen Reactions   Penicillins    Ibuprofen Rash      Discussed warning signs or symptoms. Please see discharge instructions. Patient expresses understanding.

## 2019-03-26 ENCOUNTER — Ambulatory Visit (INDEPENDENT_AMBULATORY_CARE_PROVIDER_SITE_OTHER): Payer: Managed Care, Other (non HMO) | Admitting: Physical Therapy

## 2019-03-26 ENCOUNTER — Other Ambulatory Visit: Payer: Self-pay

## 2019-03-26 ENCOUNTER — Encounter: Payer: Self-pay | Admitting: Physical Therapy

## 2019-03-26 DIAGNOSIS — M6281 Muscle weakness (generalized): Secondary | ICD-10-CM | POA: Diagnosis not present

## 2019-03-26 DIAGNOSIS — M25561 Pain in right knee: Secondary | ICD-10-CM

## 2019-03-26 DIAGNOSIS — R2689 Other abnormalities of gait and mobility: Secondary | ICD-10-CM | POA: Diagnosis not present

## 2019-03-26 NOTE — Therapy (Signed)
[facility] [place] [address] [place], [place], [address] Phone: [phone]   Fax:  [phone]  Physical Therapy Evaluation  Patient Details  Name: Darren Crawford MRN: [id number] Date of Birth: 1985/09/04 Referring Provider (PT): Darren Crawford, Darren S, MD   Encounter Date: 03/26/2019  PT End of Session - 03/26/19 1230    Visit Number  1    Number of Visits  12    Date for PT Re-Evaluation  05/07/19    PT Start Time  1148    PT Stop Time  1218    PT Time Calculation (min)  30 min    Activity Tolerance  Patient tolerated treatment well    Behavior During Therapy  [facility] for tasks assessed/performed       Past Medical History:  Diagnosis Date  . Asthma   . Chronic allergic rhinitis 08/03/2016  . Morbid obesity (HCC) 08/03/2016  . Obstructive sleep apnea syndrome 08/03/2016    History reviewed. No pertinent surgical history.  There were no vitals filed for this visit.   Subjective Assessment - 03/26/19 1151    Subjective  Pt is a 33 y/o male who presents to OPPT for Rt knee injury 03/09/19.  Pt states he had his foot propped up on ottoman and one of his children jumped on his leg resulting in tear of proximal gastroc tendon.  Pt saw orthopedic surgeon - and plan to treat conservatively.    Limitations  Standing;Walking    Diagnostic tests  MRI: gastroc tear    Patient Stated Goals  return back to normal, be able to walk better    Currently in Pain?  Yes    Pain Score  0-No pain   only c/o quick short lived sharp pain up to 7/10   Pain Location  Knee    Pain Orientation  Right    Pain Descriptors / Indicators  Sharp    Pain Type  Acute pain    Pain Onset  1 to 4 weeks ago    Pain Frequency  Intermittent    Aggravating Factors   quick pivoting movements    Pain Relieving Factors  stop movement, tylenol         [facility] PT Assessment - 03/26/19 1156      Assessment   Medical Diagnosis  S86.111A (ICD-10-CM) - Rupture of right  gastrocnemius tendon, initial encounter    Referring Provider (PT)  Darren Crawford, Darren S, MD    Onset Date/Surgical Date  03/09/19    Hand Dominance  Right    Next MD Visit  Dr Darren Crawford 2 weeks    Prior Therapy  at this clinic for shoulder injury      Precautions   Precautions  None      Restrictions   Weight Bearing Restrictions  No      Balance Screen   Has the patient fallen in the past 6 months  No    Has the patient had a decrease in activity level because of a fear of falling?   No    Is the patient reluctant to leave their home because of a fear of falling?   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    Leisure  movies; no regular exercise (some walking)      Cognition   Overall Cognitive Status  Within Functional Limits for tasks assessed      Posture/Postural Control   Posture/Postural Control  Postural limitations    Postural Limitations  Rounded Shoulders;Forward head      ROM / Strength   AROM / PROM / Strength  AROM;Strength;PROM      AROM   Overall AROM Comments  Rt ankle WNL    AROM Assessment Site  Knee;Ankle    Right/Left Knee  Right;Left    Right Knee Extension  0    Right Knee Flexion  110    Left Knee Extension  3    Left Knee Flexion  30      PROM   PROM Assessment Site  Knee    Right/Left Knee  Right    Right Knee Flexion  124      Strength   Overall  Strength Comments  Rt knee 3/5; Rt ankle plantarflexion 3+/5 (measured supine)      Palpation   Palpation comment  tenderness posterior lateral Rt knee      Ambulation/Gait   Gait Pattern  Decreased stance time - right;Decreased step length - left;Decreased hip/knee flexion - right;Antalgic                Objective measurements completed on examination: See above findings.      [facility] Adult PT Treatment/Exercise - 03/26/19 1156      Exercises   Exercises  Knee/Hip      Knee/Hip Exercises: Stretches   Quad Stretch  Right;1 rep;30 seconds      Knee/Hip Exercises: Supine   Quad Sets  Right;5 reps    Straight Leg Raises  Right;5 reps    Straight Leg Raises Limitations  cues to decrease extensor lag      Knee/Hip Exercises: Prone   Hamstring Curl  10 reps    Hamstring Curl Limitations  Rt             PT Education - 03/26/19 1230    Education Details  HEP    Person(s) Educated  Patient    Methods  Explanation;Demonstration;Handout    Comprehension  Verbalized understanding;Returned demonstration          PT Long Term Goals - 03/26/19 1234      PT LONG TERM GOAL #1   Title  independent with HEP    Status  New    Target Date  05/07/19      PT LONG TERM GOAL #2   Title  improve Rt knee flexion to at least 125 degrees for improved function    Status  New    Target Date  05/07/19      PT LONG TERM GOAL #3   Title  ambulate without deviations or increase in pain for improved function    Status  New    Target Date  05/07/19      PT LONG TERM  GOAL #4   Title  demostrate at least 4/5 Rt knee and Rt plantarflexion strength for improved function    Status  New    Target Date  05/07/19      PT LONG TERM GOAL #5   Title  report no pain with quick pivotal movements for improved function    Status  New    Target Date  05/07/19             Plan - 03/26/19 1231    Clinical Impression Statement  Pt is a 33 y/o male who presents to OPPT for Rt  proximal gastroc tear on 03/09/19.  Pt demonstrates decreased ROM and strength as well as gait abnormalities and pain affecting functional mobility.  Pt will benefit from PT to address deficits listed.    Personal Factors and Comorbidities  Comorbidity 1    Comorbidities  obesity    Examination-Activity Limitations  Stand;Geophysical data processor for Others    Examination-Participation Restrictions  Church    Stability/Clinical Decision Making  Evolving/Moderate complexity    Clinical Decision Making  Moderate    Rehab Potential  Good    PT Frequency  2x / week   1x/wk x 2 wks then progress to 2x/wk if able to progress strengthening   PT Duration  6 weeks    PT Treatment/Interventions  ADLs/Self Care Home Management;Cryotherapy;Electrical Stimulation;Ultrasound;Moist Heat;Iontophoresis 4mg /ml Dexamethasone;Gait training;Stair training;Functional mobility training;Therapeutic activities;Therapeutic exercise;Balance training;Neuromuscular re-education;Patient/family education;Passive range of motion;Manual techniques;Dry needling;Taping;Vasopneumatic Device    PT Next Visit Plan  review HEP, progress gentle strengthening, manual/modalities PRN    PT Home Exercise Plan  Access Code: [email or web address]    Consulted and Agree with Plan of Care  Patient       Patient will benefit from skilled therapeutic intervention in order to improve the following deficits and impairments:  Abnormal gait, Difficulty walking, Decreased strength, Pain, Decreased mobility, Decreased range of motion  Visit Diagnosis: Acute pain of right knee - Plan: PT plan of care cert/re-cert  Muscle weakness (generalized) - Plan: PT plan of care cert/re-cert  Other abnormalities of gait and mobility - Plan: PT plan of care cert/re-cert     Problem List Patient Active Problem List   Diagnosis Date Noted  . Tendinitis of right rotator cuff 12/13/2017  . Chronic allergic rhinitis 08/03/2016  . Morbid obesity (HCC) 08/03/2016  .  Obstructive sleep apnea syndrome 08/03/2016      08/05/2016, PT, DPT 03/26/19 12:38 PM     [facility] [address] [place], [place], [place] Phone: [phone]   Fax:  [phone]  Name: Darren Crawford MRN: Darren Hoyer Date of Birth: 1985-09-03

## 2019-03-26 NOTE — Patient Instructions (Signed)
Access Code: [email or web address]  URL: [email or web address]  Date: 03/26/2019  Prepared by: Faustino Congress   Exercises  Prone Quadriceps Stretch with Strap - 3 reps - 1 sets - 30 sec hold - 2x daily - 7x weekly  Prone Knee Flexion - 10 reps - 1 sets - 2x daily - 7x weekly  Quad Set - 10 reps - 1 sets - 5-10 sec hold hold - 2x daily - 7x weekly  Supine Straight Leg Raises - 10 reps - 1 sets - 2x daily - 7x weekly

## 2019-04-01 ENCOUNTER — Other Ambulatory Visit: Payer: Self-pay

## 2019-04-01 ENCOUNTER — Ambulatory Visit (INDEPENDENT_AMBULATORY_CARE_PROVIDER_SITE_OTHER): Payer: Managed Care, Other (non HMO) | Admitting: Rehabilitative and Restorative Service Providers"

## 2019-04-01 DIAGNOSIS — M6281 Muscle weakness (generalized): Secondary | ICD-10-CM

## 2019-04-01 DIAGNOSIS — M25561 Pain in right knee: Secondary | ICD-10-CM | POA: Diagnosis not present

## 2019-04-01 DIAGNOSIS — R2689 Other abnormalities of gait and mobility: Secondary | ICD-10-CM

## 2019-04-01 NOTE — Patient Instructions (Signed)
Access Code: [email or web address]  URL: [email or web address]  Date: 04/01/2019  Prepared by: Rudell Cobb   Exercises Prone Quadriceps Stretch with Strap - 3 reps - 1 sets - 30 sec hold - 2x daily - 7x weekly Seated Hamstring Stretch - 3 reps - 1 sets - 20 seconds hold - 2x daily - 7x weekly Gastroc Stretch on Wall - 2-3 reps - 1 sets - 20 seconds hold - 2x daily - 7x weekly Supine Straight Leg Raises - 10 reps - 1 sets - 3-5 seconds hold - 2x daily - 7x weekly Standing Alternating Knee Flexion - 10 reps - 1 sets - 3-5 seconds hold - 2x daily - 7x weekly Standing Heel Raise - 10 reps - 1 sets - 3 seconds hold - 2x daily - 7x weekly Seated Ankle Alphabet - 1 reps - 1 sets - 1x daily - 7x weekly

## 2019-04-01 NOTE — Therapy (Signed)
[place], [place], [address] Phone: [phone]   Fax:  [phone]  Physical Therapy Treatment  Patient Details  Name: Darren Crawford MRN: [id number] Date of Birth: [birth year] Referring Provider (PT): Gregor Hams, MD   Encounter Date: 04/01/2019  PT End of Session - 04/01/19 1148    Visit Number  2    Number of Visits  12    Date for PT Re-Evaluation  05/07/19    PT Start Time  1112    PT Stop Time  1155    PT Time Calculation (min)  43 min    Activity Tolerance  Patient tolerated treatment well    Behavior During Therapy  [facility] for tasks assessed/performed       Past Medical History:  Diagnosis Date  . Asthma   . Chronic allergic rhinitis 08/03/2016  . Morbid obesity ([place]) 08/03/2016  . Obstructive sleep apnea syndrome 08/03/2016    No past surgical history on file.  There were no vitals filed for this visit.  Subjective Assessment - 04/01/19 1112    Subjective  The patinet reports pain with planting the right foot and turning.  He is continuing to wear the knee brace during the day and removes at night.  No problems with the exercises.    Patient Stated Goals  return back to normal, be able to walk better    Currently in Pain?  No/denies                       [facility] Adult PT Treatment/Exercise - 04/01/19 1118      Exercises   Exercises  Knee/Hip;Ankle      Knee/Hip Exercises: Stretches   Active Hamstring Stretch  Right;3 reps;20 seconds    Quad Stretch  Right      Knee/Hip Exercises: Standing   Knee Flexion  Strengthening;Right;10 reps      Knee/Hip Exercises: Supine   Straight Leg Raises  10 reps;Right;Strengthening    Straight Leg Raises Limitations  Note fatigue with repetition and added quad set to initiate movement.      Knee/Hip Exercises: Sidelying   Hip ABduction  Strengthening;Right;10 reps      Knee/Hip Exercises: Prone   Hamstring Curl  10 reps    right leg   Hip Extension  Strengthening;Right   8 reps,      Modalities   Modalities  Cryotherapy      Cryotherapy   Number Minutes Cryotherapy  10 Minutes    Cryotherapy Location  Knee    Type of Cryotherapy  Other (comment)   game ready R knee for compression/ice     Ankle Exercises: Stretches   Gastroc Stretch  2 reps;20 seconds    Gastroc Stretch Limitations  went to sensation of pulling, no pain.        Ankle Exercises: Seated   ABC's  1 rep    ABC's Limitations  fatigue noted with muscle tremor     Ankle Circles/Pumps  AROM;10 reps    BAPS  Level 3   12 reps   BAPS Limitations  notes fatigue and tenderness in anterior knee             PT Education - 04/01/19 1149    Education Details  HEP    Person(s) Educated  Patient    Methods  Explanation;Demonstration;Handout    Comprehension  Verbalized understanding;Returned demonstration  PT Long Term Goals - 03/26/19 1234      PT LONG TERM GOAL #1   Title  independent with HEP    Status  New    Target Date  05/07/19      PT LONG TERM GOAL #2   Title  improve Rt knee flexion to at least 125 degrees for improved function    Status  New    Target Date  05/07/19      PT LONG TERM GOAL #3   Title  ambulate without deviations or increase in pain for improved function    Status  New    Target Date  05/07/19      PT LONG TERM GOAL #4   Title  demostrate at least 4/5 Rt knee and Rt plantarflexion strength for improved function    Status  New    Target Date  05/07/19      PT LONG TERM GOAL #5   Title  report no pain with quick pivotal movements for improved function    Status  New    Target Date  05/07/19            Plan - 04/01/19 1149    Clinical Impression Statement  The patient has significant reduction in pain today noting discomfort after planting and rotating with the right leg.  The patient is tolerating progression of home exercises and tolerating gentle stretching of gastrocs and  hamstring.    Personal Factors and Comorbidities  Comorbidity 1    Comorbidities  obesity    Examination-Activity Limitations  Stand;Geophysical data processor for Others    Examination-Participation Restrictions  Church    Stability/Clinical Decision Making  Evolving/Moderate complexity    Rehab Potential  Good    PT Frequency  2x / week   1x/wk x 2 wks then progress to 2x/wk if able to progress strengthening   PT Duration  6 weeks    PT Treatment/Interventions  ADLs/Self Care Home Management;Cryotherapy;Electrical Stimulation;Ultrasound;Moist Heat;Iontophoresis 4mg /ml Dexamethasone;Gait training;Stair training;Functional mobility training;Therapeutic activities;Therapeutic exercise;Balance training;Neuromuscular re-education;Patient/family education;Passive range of motion;Manual techniques;Dry needling;Taping;Vasopneumatic Device    PT Next Visit Plan  Progress HEP as tolerated, continue loading and progress through bilateral heel raises working to unilateral as able.  Add further loading including step ups, step downs, standing quad and hamstring stretches.    PT Home Exercise Plan  Access Code: [email or web address]    Consulted and Agree with Plan of Care  Patient       Patient will benefit from skilled therapeutic intervention in order to improve the following deficits and impairments:  Abnormal gait, Difficulty walking, Decreased strength, Pain, Decreased mobility, Decreased range of motion  Visit Diagnosis: Acute pain of right knee  Muscle weakness (generalized)  Other abnormalities of gait and mobility     Problem List Patient Active Problem List   Diagnosis Date Noted  . Tendinitis of right rotator cuff 12/13/2017  . Chronic allergic rhinitis 08/03/2016  . Morbid obesity (HCC) 08/03/2016  . Obstructive sleep apnea syndrome 08/03/2016    Franca Stakes 04/01/2019, 12:10 PM  [facility] [address] [place], [place],  [place] Phone: [phone]   Fax:  [phone]  Name: Darren Crawford MRN: Dara Hoyer Date of Birth: Oct 19, 1985

## 2019-04-11 ENCOUNTER — Encounter: Payer: Managed Care, Other (non HMO) | Admitting: Rehabilitative and Restorative Service Providers"

## 2019-04-12 ENCOUNTER — Other Ambulatory Visit: Payer: Self-pay

## 2019-04-12 ENCOUNTER — Ambulatory Visit (INDEPENDENT_AMBULATORY_CARE_PROVIDER_SITE_OTHER): Payer: Managed Care, Other (non HMO) | Admitting: Rehabilitative and Restorative Service Providers"

## 2019-04-12 ENCOUNTER — Encounter: Payer: Self-pay | Admitting: Rehabilitative and Restorative Service Providers"

## 2019-04-12 DIAGNOSIS — R2689 Other abnormalities of gait and mobility: Secondary | ICD-10-CM

## 2019-04-12 DIAGNOSIS — M25561 Pain in right knee: Secondary | ICD-10-CM | POA: Diagnosis not present

## 2019-04-12 DIAGNOSIS — M6281 Muscle weakness (generalized): Secondary | ICD-10-CM

## 2019-04-12 NOTE — Therapy (Signed)
[place], [place], [address] Phone: [phone]   Fax:  [phone]  Physical Therapy Treatment  Patient Details  Name: Darren Crawford MRN: [id number] Date of Birth: 06-20-1985 Referring Provider (PT): Gregor Hams, MD   Encounter Date: 04/12/2019  PT End of Session - 04/12/19 0939    Visit Number  3    Number of Visits  12    Date for PT Re-Evaluation  05/07/19    PT Start Time  0932    PT Stop Time  1027    PT Time Calculation (min)  55 min    Activity Tolerance  Patient tolerated treatment well    Behavior During Therapy  [facility] for tasks assessed/performed       Past Medical History:  Diagnosis Date  . Asthma   . Chronic allergic rhinitis 08/03/2016  . Morbid obesity ([place]) 08/03/2016  . Obstructive sleep apnea syndrome 08/03/2016    History reviewed. No pertinent surgical history.  There were no vitals filed for this visit.  Subjective Assessment - 04/12/19 0937    Subjective  The patient saw MD yesterday and ortho MD wanted him to d/c brace unless running, playing with the kids.    Patient Stated Goals  return back to normal, be able to walk better    Currently in Pain?  No/denies                       [facility] Adult PT Treatment/Exercise - 04/12/19 0953      Ambulation/Gait   Ambulation/Gait  Yes    Gait Comments  Walked in between dynamic standing activities as a check in with pain/fatigue and to stretch between sets.  Treadmill at an incline x 5 minutes x 5% incline dropped to 3% incline after 2 minutes.        High Level Balance   High Level Balance Comments  foam standing on R LE for unilateral stance control and ankle/knee/hip co-contraction; performed rolling a ball anteriorly and laterally working on engaging R LE musculature for stance control and multi-directional challenge x 8 minutes with intermittent resting.      Exercises   Exercises  Knee/Hip;Ankle       Knee/Hip Exercises: Stretches   Active Hamstring Stretch  Right    Gastroc Stretch  Right;Left;30 seconds;2 reps    Soleus Stretch  Right;1 rep;10 seconds    Soleus Stretch Limitations  patient gets fatigue tremor (did this stretch at end of session -- will attempt next visit).      Knee/Hip Exercises: Plyometrics   Bilateral Jumping  10 reps    Bilateral Jumping Limitations  Jumped anteriorly in agaility ladder and then front/back x 5 reps into one box.      Knee/Hip Exercises: Standing   Heel Raises  Both;Right;10 reps    Heel Raises Limitations  Began bilateral and progressed to unilateral    Lateral Step Up  10 reps;Right    Forward Step Up  10 reps;Right    Functional Squat  10 reps    Other Standing Knee Exercises  Marching in agility ladder wide to narrow leading R "out/out/in/in" and then left  x 2 reps, marching forwards/backwards into agility ladder x 5 reps.      Modalities   Modalities  Vasopneumatic      Vasopneumatic   Number Minutes Vasopneumatic   10 minutes    Vasopnuematic Location   Knee    Vasopneumatic  Pressure  Medium    Vasopneumatic Temperature   34             PT Education - 04/12/19 1017    Education Details  HEP    Person(s) Educated  Patient    Methods  Explanation;Demonstration;Handout    Comprehension  Verbalized understanding;Returned demonstration          PT Long Term Goals - 04/12/19 1310      PT LONG TERM GOAL #1   Title  independent with HEP    Status  New      PT LONG TERM GOAL #2   Title  improve Rt knee flexion to at least 125 degrees for improved function    Status  New      PT LONG TERM GOAL #3   Title  ambulate without deviations or increase in pain for improved function    Baseline  Met on level ground; noted mild R medial discomfort at 5% incline.    Status  Partially Met      PT LONG TERM GOAL #4   Title  demostrate at least 4/5 Rt knee and Rt plantarflexion strength for improved function    Status  New       PT LONG TERM GOAL #5   Title  report no pain with quick pivotal movements for improved function    Status  New            Plan - 04/12/19 0959    Clinical Impression Statement  The patient is progressing well and tolerated moving into standing activities with multi-directional balance challenges.  PT to continue to progress to LTGs.  Patient is walking without gait deviations onlevel ground.  He does have some R medial knee discomfort with 5% incline.    Personal Factors and Comorbidities  Comorbidity 1    Comorbidities  obesity    Examination-Activity Limitations  Stand;Patent attorney for Others    Examination-Participation Restrictions  Church    Stability/Clinical Decision Making  Evolving/Moderate complexity    Rehab Potential  Good    PT Frequency  2x / week   1x/wk x 2 wks then progress to 2x/wk if able to progress strengthening   PT Duration  6 weeks    PT Treatment/Interventions  ADLs/Self Care Home Management;Cryotherapy;Electrical Stimulation;Ultrasound;Moist Heat;Iontophoresis 44m/ml Dexamethasone;Gait training;Stair training;Functional mobility training;Therapeutic activities;Therapeutic exercise;Balance training;Neuromuscular re-education;Patient/family education;Passive range of motion;Manual techniques;Dry needling;Taping;Vasopneumatic Device    PT Next Visit Plan  Progress to greater single limb balance challenges and begin to add to home, uphill walking, progress standing strengthening and encourage return to wellness activity for post d/c.    PT Home Exercise Plan  Access Code: [email or web address]    Consulted and Agree with Plan of Care  Patient       Patient will benefit from skilled therapeutic intervention in order to improve the following deficits and impairments:  Abnormal gait, Difficulty walking, Decreased strength, Pain, Decreased mobility, Decreased range of motion  Visit Diagnosis: Acute pain of right knee  Muscle weakness (generalized)  Other  abnormalities of gait and mobility     Problem List Patient Active Problem List   Diagnosis Date Noted  . Tendinitis of right rotator cuff 12/13/2017  . Chronic allergic rhinitis 08/03/2016  . [place] 08/03/2016  . Obstructive sleep apnea syndrome 08/03/2016    Amzie Sillas, PT 04/12/2019, 1:12 PM  [facility] [address]Phone: [phone]  Fax:  [phone]  Name: Kealii Thueson MRN: [id number] Date of Birth: [birth year]

## 2019-04-12 NOTE — Patient Instructions (Signed)
Access Code: [email or web address]  URL: [email or web address]  Date: 04/12/2019  Prepared by: Rudell Cobb   Exercises Prone Quadriceps Stretch with Strap - 3 reps - 1 sets - 30 sec hold - 2x daily - 7x weekly Seated Hamstring Stretch - 3 reps - 1 sets - 20 seconds hold - 2x daily - 7x weekly Gastroc Stretch on Wall - 2-3 reps - 1 sets - 20 seconds hold - 2x daily - 7x weekly Supine Straight Leg Raises - 10 reps - 1 sets - 3-5 seconds hold - 2x daily - 7x weekly Single Leg Heel Raise with Chair Support - 10 reps - 1 sets - 3 seconds hold - 2x daily - 7x weekly Squat with Chair Touch - 10 reps - 2 sets - 3 seconds hold - 2x daily - 7x weekly Seated Ankle Alphabet - 1 reps - 1 sets - 1x daily - 7x weekly

## 2019-04-15 ENCOUNTER — Encounter: Payer: Managed Care, Other (non HMO) | Admitting: Rehabilitative and Restorative Service Providers"

## 2019-04-19 ENCOUNTER — Ambulatory Visit (INDEPENDENT_AMBULATORY_CARE_PROVIDER_SITE_OTHER): Payer: Managed Care, Other (non HMO) | Admitting: Rehabilitative and Restorative Service Providers"

## 2019-04-19 ENCOUNTER — Other Ambulatory Visit: Payer: Self-pay

## 2019-04-19 ENCOUNTER — Encounter: Payer: Self-pay | Admitting: Rehabilitative and Restorative Service Providers"

## 2019-04-19 DIAGNOSIS — M25561 Pain in right knee: Secondary | ICD-10-CM | POA: Diagnosis not present

## 2019-04-19 DIAGNOSIS — M6281 Muscle weakness (generalized): Secondary | ICD-10-CM | POA: Diagnosis not present

## 2019-04-19 DIAGNOSIS — R2689 Other abnormalities of gait and mobility: Secondary | ICD-10-CM | POA: Diagnosis not present

## 2019-04-19 NOTE — Therapy (Signed)
[place], [place], [address] Phone: [phone]   Fax:  [phone]  Physical Therapy Treatment  Patient Details  Name: Darren Crawford MRN: [id number] Date of Birth: 06-18-1985 Referring Provider (PT): Gregor Hams, MD   Encounter Date: 04/19/2019  PT End of Session - 04/19/19 1442    Visit Number  4    Number of Visits  12    Date for PT Re-Evaluation  05/07/19    PT Start Time  1406    PT Stop Time  1445    PT Time Calculation (min)  39 min    Activity Tolerance  Patient tolerated treatment well    Behavior During Therapy  [facility] for tasks assessed/performed       Past Medical History:  Diagnosis Date  . Asthma   . Chronic allergic rhinitis 08/03/2016  . Morbid obesity ([place]) 08/03/2016  . Obstructive sleep apnea syndrome 08/03/2016    History reviewed. No pertinent surgical history.  There were no vitals filed for this visit.  Subjective Assessment - 04/19/19 1407    Subjective  The patient reports he is taking steps more at work without pain in the knee.  He is able to walk without pain.    Patient Stated Goals  return back to normal, be able to walk better    Currently in Pain?  No/denies                       [facility] Adult PT Treatment/Exercise - 04/19/19 1414      Ambulation/Gait   Ambulation/Gait  Yes    Gait Comments  Treadmill x 37mnutes up to 5% incline without pain.        Knee/Hip Exercises: Stretches   Active Hamstring Stretch  Right    Active Hamstring Stretch Limitations  seated hamstring stretch    Gastroc Stretch  Right;Left;2 reps;30 seconds    Soleus Stretch  Right;2 reps      Knee/Hip Exercises: Plyometrics   Bilateral Jumping  10 reps    Bilateral Jumping Limitations  fast feet jumping wide to narrow into agility ladder alternating, then in Jumping jack type movement, ant/posterior into/out of agility ladder boxes    Other Plyometric Exercises   jumping in place x 5 reps without pain or difficulty      Knee/Hip Exercises: Standing   Heel Raises  Right    Heel Raises Limitations  Unilateral heel raise R side    Forward Lunges  10 reps;Right;Left    Forward Lunges Limitations  split lunges    Other Standing Knee Exercises  Fitter for lateral stability x 2 minutes.             PT Education - 04/19/19 1637    Education Details  progressed HEP    Person(s) Educated  Patient    Methods  Explanation;Demonstration;Handout    Comprehension  Verbalized understanding;Returned demonstration          PT Long Term Goals - 04/19/19 1641      PT LONG TERM GOAL #1   Title  independent with HEP    Status  On-going    Target Date  05/07/19      PT LONG TERM GOAL #2   Title  improve Rt knee flexion to at least 125 degrees for improved function    Status  On-going      PT LONG TERM GOAL #3   Title  ambulate without  deviations or increase in pain for improved function    Baseline  Met on level ground    Status  Achieved      PT LONG TERM GOAL #4   Title  demostrate at least 4/5 Rt knee and Rt plantarflexion strength for improved function    Status  On-going      PT LONG TERM GOAL #5   Title  report no pain with quick pivotal movements for improved function    Status  On-going            Plan - 04/19/19 1642    Clinical Impression Statement  The patient is progressing to [place].  PT reduced frequency to f/u in 2 weeks to check progress on current HEP.  Continue to progress strengthening for return to dynamic activities without pain.    PT Treatment/Interventions  ADLs/Self Care Home Management;Cryotherapy;Electrical Stimulation;Ultrasound;Moist Heat;Iontophoresis 22m/ml Dexamethasone;Gait training;Stair training;Functional mobility training;Therapeutic activities;Therapeutic exercise;Balance training;Neuromuscular re-education;Patient/family education;Passive range of motion;Manual techniques;Dry  needling;Taping;Vasopneumatic Device    PT Next Visit Plan  Progress to greater single limb balance challenges and begin to add to home, uphill walking, progress standing strengthening and encourage return to wellness activity for post d/c.    PT Home Exercise Plan  Access Code: [email or web address]    Consulted and Agree with Plan of Care  Patient       Patient will benefit from skilled therapeutic intervention in order to improve the following deficits and impairments:  Abnormal gait, Difficulty walking, Decreased strength, Pain, Decreased mobility, Decreased range of motion  Visit Diagnosis: Acute pain of right knee  Muscle weakness (generalized)  Other abnormalities of gait and mobility     Problem List Patient Active Problem List   Diagnosis Date Noted  . Tendinitis of right rotator cuff 12/13/2017  . Chronic allergic rhinitis 08/03/2016  . Morbid obesity ([place] 08/03/2016  . Obstructive sleep apnea syndrome 08/03/2016    Zaylyn Bergdoll, PT 04/19/2019, 4:43 PM  [facility] [place] [address]Phone: [phone]  Fax:  [phone] Name: Darren LivingstoneMRN: [id number] of Birth: [birth year]

## 2019-04-19 NOTE — Patient Instructions (Signed)
Access Code: [email or web address]  URL: [email or web address]  Date: 04/19/2019  Prepared by: Rudell Cobb   Exercises Prone Quadriceps Stretch with Strap - 3 reps - 1 sets - 30 sec hold - 2x daily - 7x weekly Seated Hamstring Stretch with Chair - 3 reps - 2 sets - 30 seconds hold - 2x daily - 7x weekly Gastroc Stretch on Wall - 2-3 reps - 1 sets - 20 seconds hold - 2x daily - 7x weekly Single Leg Heel Raise with Chair Support - 10 reps - 1 sets - 3 seconds hold - 2x daily - 7x weekly Squat with Chair Touch - 10 reps - 2 sets - 3 seconds hold - 2x daily - 7x weekly Static Lunge - 10 reps - 1 sets - 3-5 seconds hold - 2x daily - 7x weekly Braided Sidestepping - 10 reps - 1 sets - 2x daily - 7x weekly

## 2019-04-26 ENCOUNTER — Encounter: Payer: Managed Care, Other (non HMO) | Admitting: Rehabilitative and Restorative Service Providers"

## 2019-05-03 ENCOUNTER — Encounter: Payer: Managed Care, Other (non HMO) | Admitting: Rehabilitative and Restorative Service Providers"

## 2019-05-06 ENCOUNTER — Other Ambulatory Visit: Payer: Self-pay

## 2019-05-06 ENCOUNTER — Ambulatory Visit (INDEPENDENT_AMBULATORY_CARE_PROVIDER_SITE_OTHER): Payer: Managed Care, Other (non HMO) | Admitting: Physical Therapy

## 2019-05-06 ENCOUNTER — Encounter: Payer: Self-pay | Admitting: Physical Therapy

## 2019-05-06 DIAGNOSIS — M6281 Muscle weakness (generalized): Secondary | ICD-10-CM | POA: Diagnosis not present

## 2019-05-06 DIAGNOSIS — R2689 Other abnormalities of gait and mobility: Secondary | ICD-10-CM | POA: Diagnosis not present

## 2019-05-06 NOTE — Therapy (Signed)
[place], [place], [address] Phone: [phone]   Fax:  [phone]  Physical Therapy Treatment  Patient Details  Name: Darren Crawford MRN: [id number] Date of Birth: [birth year] Referring Provider (PT): Gregor Hams, MD   Encounter Date: 05/06/2019  PT End of Session - 05/06/19 0846    Visit Number  5    Number of Visits  12    Date for PT Re-Evaluation  05/07/19    PT Start Time  0846    PT Stop Time  0930    PT Time Calculation (min)  44 min    Activity Tolerance  Patient tolerated treatment well    Behavior During Therapy  [facility] for tasks assessed/performed       Past Medical History:  Diagnosis Date  . Asthma   . Chronic allergic rhinitis 08/03/2016  . Morbid obesity ([place]) 08/03/2016  . Obstructive sleep apnea syndrome 08/03/2016    History reviewed. No pertinent surgical history.  There were no vitals filed for this visit.  Subjective Assessment - 05/06/19 1720    Subjective  Patient reports he is doing very well. No pain reported and he even jogged a little with no pain.    Diagnostic tests  MRI: gastroc tear    Patient Stated Goals  return back to normal, be able to walk better    Currently in Pain?  No/denies         [facility] PT Assessment - 05/06/19 0001      ROM / Strength   AROM / PROM / Strength  Strength;AROM      AROM   AROM Assessment Site  Knee    Right/Left Knee  Right    Right Knee Flexion  132      Strength   Overall Strength Comments  right knee 5/5; Right PF 5-/5                   [facility] Adult PT Treatment/Exercise - 05/06/19 0001      Knee/Hip Exercises: Stretches   Gastroc Stretch  Right;Left;2 reps;30 seconds      Knee/Hip Exercises: Plyometrics   Bilateral Jumping Limitations  fast feet jumping wide to narrow into agility ladder alternating, then in Jumping jack type movement, ant/posterior into/out of agility ladder boxes    Other  Plyometric Exercises  lateral/diagonal pushoffs x 16      Knee/Hip Exercises: Standing   Heel Raises  Right    Heel Raises Limitations  unilat x 10; eccentric lowering on step x 10 (bil up, right down)    Forward Lunges  10 reps;Right;Left    Forward Lunges Limitations  split with rotation red ball; also walking lunges on toes     SLS  dead lift x 5 difficult so went to forward reach x 10    SLS with Vectors  right on level x 5; on blue oval x 5; with plate bil x 5 each    Rebounder  x 20 then 20 on blue foam      Ankle Exercises: Aerobic   Elliptical  L3 x 4 min             PT Education - 05/06/19 1721    Education Details  progressed HEP    Person(s) Educated  Patient    Methods  Explanation;Demonstration;Handout    Comprehension  Verbalized understanding;Returned demonstration          PT Long Term Goals - 05/06/19  81      PT LONG TERM GOAL #1   Title  independent with HEP    Time  6    Period  Weeks    Status  Achieved      PT LONG TERM GOAL #2   Title  improve Rt knee flexion to at least 125 degrees for improved function    Baseline  132 deg    Time  6    Period  Weeks    Status  Achieved      PT LONG TERM GOAL #3   Title  ambulate without deviations or increase in pain for improved function    Status  Achieved      PT LONG TERM GOAL #4   Title  demostrate at least 4/5 Rt knee and Rt plantarflexion strength for improved function    Status  Achieved      PT LONG TERM GOAL #5   Title  report no pain with quick pivotal movements for improved function    Status  Achieved            Plan - 05/06/19 1726    Clinical Impression Statement  Patient presents today with no complaints of pain. He has met all LTGs and reported that he jogged a little recently with no pain. He did well with all agility work. He still has higher level balance deficits when combined with core activites, so HEP was progressed to include these.    PT Frequency  2x / week     PT Duration  6 weeks    PT Treatment/Interventions  ADLs/Self Care Home Management;Cryotherapy;Electrical Stimulation;Ultrasound;Moist Heat;Iontophoresis 89m/ml Dexamethasone;Gait training;Stair training;Functional mobility training;Therapeutic activities;Therapeutic exercise;Balance training;Neuromuscular re-education;Patient/family education;Passive range of motion;Manual techniques;Dry needling;Taping;Vasopneumatic Device    PT Next Visit Plan  see d/c summary    PT Home Exercise Plan  Access Code: [email or web address]       Patient will benefit from skilled therapeutic intervention in order to improve the following deficits and impairments:  Abnormal gait, Difficulty walking, Decreased strength, Pain, Decreased mobility, Decreased range of motion  Visit Diagnosis: Muscle weakness (generalized)  Other abnormalities of gait and mobility     Problem List Patient Active Problem List   Diagnosis Date Noted  . Tendinitis of right rotator cuff 12/13/2017  . Chronic allergic rhinitis 08/03/2016  . [place] 08/03/2016  . Obstructive sleep apnea syndrome 08/03/2016    JMadelyn FlavorsPT 05/06/2019, 5:28 PM  [facility] [address]Phone: [phone]  Fax:  [phone] Name: Darren SchickerMRN: [id number] of Birth: [birth year] PHYSICAL THERAPY DISCHARGE SUMMARY  Visits from Start of Care: 5  Current functional level related to goals / functional outcomes: See above   Remaining deficits: See above   Education / Equipment: HEP Plan: Patient agrees to discharge.  Patient goals were met. Patient is being discharged due to meeting the stated rehab goals.  ?????    JMadelyn Flavors PT 05/06/19 5:30 PM  [facility] [address] [phone](office) [phone](fax)

## 2019-05-06 NOTE — Patient Instructions (Signed)
Access Code: [email or web address]  URL: [email or web address]  Date: 05/06/2019  Prepared by: Almyra Free Dominiqua Cooner   Exercises Prone Quadriceps Stretch with Strap - 3 reps - 1 sets - 30 sec hold - 2x daily - 7x weekly Seated Hamstring Stretch with Chair - 3 reps - 2 sets - 30 seconds hold - 2x daily - 7x weekly Gastroc Stretch on Wall - 2-3 reps - 1 sets - 20 seconds hold - 2x daily - 7x weekly Squat with Chair Touch - 10 reps - 2 sets - 3 seconds hold - 2x daily - 7x weekly Braided Sidestepping - 10 reps - 1 sets - 2x daily - 7x weekly Eccentric Heel Lowering on Step - 10 reps - 3 sets - 1x daily - 7x weekly Standing Single Leg Heel Raise - 10 reps - 3 sets - 1x daily - 7x weekly Forward T - 10 reps - 3 sets - 1x daily - 7x weekly Walking Forward Lunge - 10 reps - 4 sets - 1x daily - 7x weekly Single Leg Balance with Opposite Leg Star Reach - 10 reps - 1 sets - 1x daily - 7x weekly

## 2019-06-22 NOTE — Progress Notes (Signed)
Covid home visit on behalf of [facility].  Darren Crawford stated he was feeling better and wife stated he was doing much better.  She has been managing his care & medications for him. She is very grateful for the service as she said she was scared and couldn't get any help other than going to the ED where he was d/c to home w/104 temp. VS and other visit details were transcribed into [employer] by Hale Bogus, NP w/[facility].

## 2019-11-26 ENCOUNTER — Telehealth: Payer: Managed Care, Other (non HMO) | Admitting: Emergency Medicine

## 2019-11-26 DIAGNOSIS — M5441 Lumbago with sciatica, right side: Secondary | ICD-10-CM

## 2019-11-26 MED ORDER — CYCLOBENZAPRINE HCL 10 MG PO TABS: 10.0000 mg | ORAL_TABLET | Freq: Three times a day (TID) | ORAL | 0 refills | Status: DC | PRN

## 2019-11-26 NOTE — Progress Notes (Signed)

## 2020-04-29 ENCOUNTER — Emergency Department: Admit: 2020-04-29 | Discharge status: Absent without leave | Payer: Self-pay

## 2020-04-29 ENCOUNTER — Other Ambulatory Visit: Payer: Self-pay

## 2020-04-29 ENCOUNTER — Emergency Department (INDEPENDENT_AMBULATORY_CARE_PROVIDER_SITE_OTHER): Payer: Managed Care, Other (non HMO)

## 2020-04-29 ENCOUNTER — Emergency Department
Admission: EM | Admit: 2020-04-29 | Discharge: 2020-04-29 | Disposition: A | Discharge status: Home / Self Care | Payer: Managed Care, Other (non HMO) | Source: Home / Self Care | Attending: Family Medicine | Admitting: Family Medicine

## 2020-04-29 DIAGNOSIS — M25512 Pain in left shoulder: Secondary | ICD-10-CM | POA: Diagnosis not present

## 2020-04-29 DIAGNOSIS — M7552 Bursitis of left shoulder: Secondary | ICD-10-CM | POA: Diagnosis not present

## 2020-04-29 DIAGNOSIS — M7522 Bicipital tendinitis, left shoulder: Secondary | ICD-10-CM

## 2020-04-29 MED ORDER — PREDNISONE 20 MG PO TABS: ORAL_TABLET | ORAL | 0 refills | Status: DC

## 2020-04-29 MED ORDER — HYDROCODONE-ACETAMINOPHEN 5-325 MG PO TABS: 1.0000 | ORAL_TABLET | Freq: Four times a day (QID) | ORAL | 0 refills | Status: AC | PRN

## 2020-04-29 NOTE — Discharge Instructions (Addendum)
Wear sling until improved.  Apply ice pack for 20 to 30 minutes, 3 to 4 times daily  Continue until pain and swelling decrease.  Begin range of motion and stretching exercises as tolerated.

## 2020-04-29 NOTE — ED Provider Notes (Signed)
Darren Crawford C[facility]    CSN: [id number] Arrival date & time: 04/29/20  0847      History   Chief Complaint Chief Complaint  Patient presents with  . Shoulder Pain    Left    HPI Brenda Ledford is a 34 y.o. male.   Patient complains of onset of left anterior shoulder pain with associated decreased range of motion.  He recalls no injury or changes in activities.  The history is provided by the patient.  Shoulder Pain Location:  Shoulder Shoulder location:  L shoulder Injury: no   Pain details:    Quality:  Aching   Radiates to:  Does not radiate   Severity:  Moderate   Onset quality:  Sudden   Duration:  1 day   Timing:  Constant   Progression:  Unchanged Handedness:  Right-handed Prior injury to area:  No Relieved by:  Nothing Worsened by:  Movement Ineffective treatments:  None tried Associated symptoms: decreased range of motion and stiffness   Associated symptoms: no muscle weakness, no neck pain, no numbness, no swelling and no tingling     Past Medical History:  Diagnosis Date  . Asthma   . Chronic allergic rhinitis 08/03/2016  . Morbid obesity ([facility]) 08/03/2016  . Obstructive sleep apnea syndrome 08/03/2016    Patient Active Problem List   Diagnosis Date Noted  . Tendinitis of right rotator cuff 12/13/2017  . Chronic allergic rhinitis 08/03/2016  . Morbid obesity ([facility]) 08/03/2016  . Obstructive sleep apnea syndrome 08/03/2016    History reviewed. No pertinent surgical history.     Home Medications    Prior to Admission medications   Medication Sig Start Date End Date Taking? Authorizing Provider  albuterol (PROVENTIL) (2.5 MG/3ML) 0.083% nebulizer solution Take 2.5 mg by nebulization every 6 (six) hours as needed for wheezing or shortness of breath.    [provider]  beclomethasone (QVAR) 40 MCG/ACT inhaler Inhale 1 puff into the lungs daily.    [provider]  cetirizine (ZYRTEC) 10 MG chewable tablet Chew 10 mg by mouth  daily.    [provider]  cyclobenzaprine (FLEXERIL) 10 MG tablet Take 1 tablet (10 mg total) by mouth 3 (three) times daily as needed for muscle spasms. 11/26/19   Roxy Horseman, PA-C  diclofenac sodium (VOLTAREN) 1 % GEL Apply 2 g topically 4 (four) times daily. To affected joint. Patient not taking: Reported on 03/26/2019 12/13/17   Rodolph Bong, MD  fluticasone [facility]) 50 MCG/ACT nasal spray Place into both nostrils daily.    [provider]  HYDROcodone-acetaminophen (NORCO/VICODIN) 5-325 MG tablet Take 1 tablet by mouth every 6 (six) hours as needed for up to 5 days for moderate pain or severe pain. 04/29/20 05/04/20  Lattie Haw, MD  predniSONE (DELTASONE) 20 MG tablet Take one tab by mouth twice daily for 4 days, then one daily for 3 days. Take with food. 04/29/20   Lattie Haw, MD    Family History Family History  Problem Relation Age of Onset  . Healthy Mother   . Healthy Father     Social History Social History   Tobacco Use  . Smoking status: Never Smoker  . Smokeless tobacco: Never Used  Vaping Use  . Vaping Use: Never used  Substance Use Topics  . Alcohol use: Never  . Drug use: Never     Allergies   Penicillins and Ibuprofen   Review of Systems Review of Systems  Musculoskeletal: Positive for  stiffness. Negative for joint swelling and neck pain.  All other systems reviewed and are negative.    Physical Exam Triage Vital Signs ED Triage Vitals  Enc Vitals Group     BP 04/29/20 0900 115/77     Pulse Rate 04/29/20 0900 88     Resp 04/29/20 0900 15     Temp 04/29/20 0900 98.2 F (36.8 C)     Temp Source 04/29/20 0900 Oral     SpO2 04/29/20 0900 97 %     Weight --      Height --      Head Circumference --      Peak Flow --      Pain Score 04/29/20 0858 10     Pain Loc --      Pain Edu? --      Excl. in GC? --    No data found.  Updated Vital Signs BP 115/77 (BP Location: Left Arm)   Pulse 88   Temp 98.2 F  (36.8 C) (Oral)   Resp 15   SpO2 97%   Visual Acuity Right Eye Distance:   Left Eye Distance:   Bilateral Distance:    Right Eye Near:   Left Eye Near:    Bilateral Near:     Physical Exam Vitals and nursing note reviewed.  Constitutional:      General: He is not in acute distress. HENT:     Head: Normocephalic.     Right Ear: External ear normal.     Left Ear: External ear normal.     Mouth/Throat:     Pharynx: Oropharynx is clear.  Eyes:     Pupils: Pupils are equal, round, and reactive to light.  Cardiovascular:     Rate and Rhythm: Normal rate and regular rhythm.     Heart sounds: Normal heart sounds.  Pulmonary:     Effort: Pulmonary effort is normal.     Breath sounds: Normal breath sounds.  Musculoskeletal:     Left shoulder: Tenderness present. Decreased range of motion.       Arms:     Cervical back: Normal range of motion.     Comments: Patient unable to actively or passively abduct left shoulder above horizontal.  Apley's and empty can tests positive.  Yergasens test positive.  Decreased internal/external rotation/strength. There is distinct tenderness to palpation over the insertion of the left long head of the biceps tendon. There is also distinct tenderness to palpation over the subacromial bursa.  Skin:    General: Skin is warm and dry.  Neurological:     Mental Status: He is alert.      UC Treatments / Results  Labs (all labs ordered are listed, but only abnormal results are displayed) Labs Reviewed - No data to display  EKG   Radiology DG Shoulder Left  Result Date: 04/29/2020 CLINICAL DATA:  Pain and shortness EXAM: LEFT SHOULDER - 2+ VIEW COMPARISON:  None. FINDINGS: Oblique, Y scapular, and axillary images were obtained. No fracture or dislocation. No joint space narrowing or erosion. No intra-articular calcification. Visualized left lung clear. IMPRESSION: No fracture or dislocation.  No evident arthropathy. Electronically Signed   By:  Bretta Bang III M.D.   On: 04/29/2020 09:46    Procedures Procedures (including critical care time)  Medications Ordered in UC Medications - No data to display  Initial Impression / Assessment and Plan / UC Course  I have reviewed the triage vital signs and the nursing notes.  Pertinent labs & imaging results that were available during my care of the patient were reviewed by me and considered in my medical decision making (see chart for details).    Dispensed sling.  Begin prednisone burst/taper.  Rx for Vicodin (#10, no refill). Controlled Substance Prescriptions I have consulted the [place] Controlled Substances Registry for this patient, and feel the risk/benefit ratio today is favorable for proceeding with this prescription for a controlled substance.  Followup with Dr. Rodney Langton ([facility]) if not improving about one week.   Final Clinical Impressions(s) / UC Diagnoses   Final diagnoses:  Acute bursitis of left shoulder  Biceps tendonitis on left     Discharge Instructions     Wear sling until improved.  Apply ice pack for 20 to 30 minutes, 3 to 4 times daily  Continue until pain and swelling decrease.  Begin range of motion and stretching exercises as tolerated.    ED Prescriptions    Medication Sig Dispense Auth. Provider   predniSONE (DELTASONE) 20 MG tablet Take one tab by mouth twice daily for 4 days, then one daily for 3 days. Take with food. 11 tablet Lattie Haw, MD   HYDROcodone-acetaminophen (NORCO/VICODIN) 5-325 MG tablet Take 1 tablet by mouth every 6 (six) hours as needed for up to 5 days for moderate pain or severe pain. 10 tablet Lattie Haw, MD        Lattie Haw, MD 05/05/20 [phone]

## 2020-04-29 NOTE — ED Triage Notes (Signed)
Patient presents to Urgent Care with complaints of left anterior shoulder pain since yesterday. Patient reports he cannot think of what he could have done to injure it, hurts to lift his arm. Denies hx of injury.

## 2020-06-03 ENCOUNTER — Telehealth: Payer: Managed Care, Other (non HMO) | Admitting: Family

## 2020-06-03 DIAGNOSIS — M545 Low back pain, unspecified: Secondary | ICD-10-CM | POA: Diagnosis not present

## 2020-06-03 MED ORDER — BACLOFEN 10 MG PO TABS: 10.0000 mg | ORAL_TABLET | Freq: Three times a day (TID) | ORAL | 0 refills | Status: DC

## 2020-06-03 NOTE — Progress Notes (Signed)

## 2021-01-29 IMAGING — DX DG KNEE COMPLETE 4+V*R*
4 series · 4 of 4 positions shown · non-contrast
Comparison: None.

CLINICAL DATA: Acute right knee pain without known injury.

EXAM:
RIGHT KNEE - COMPLETE 4+ VIEW

[knee ap]
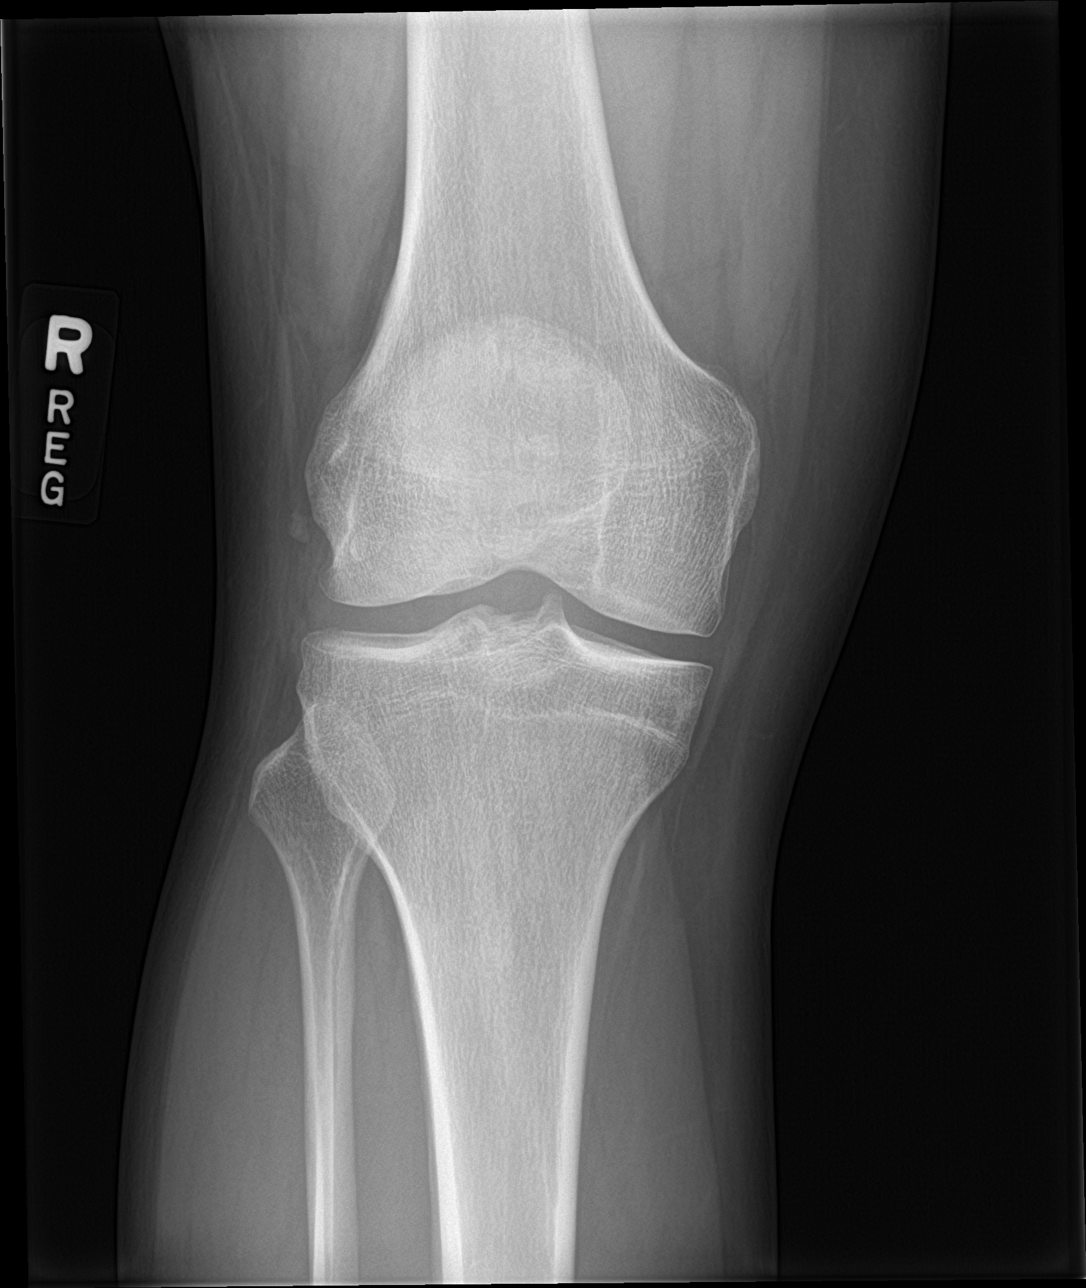

[tunnel]
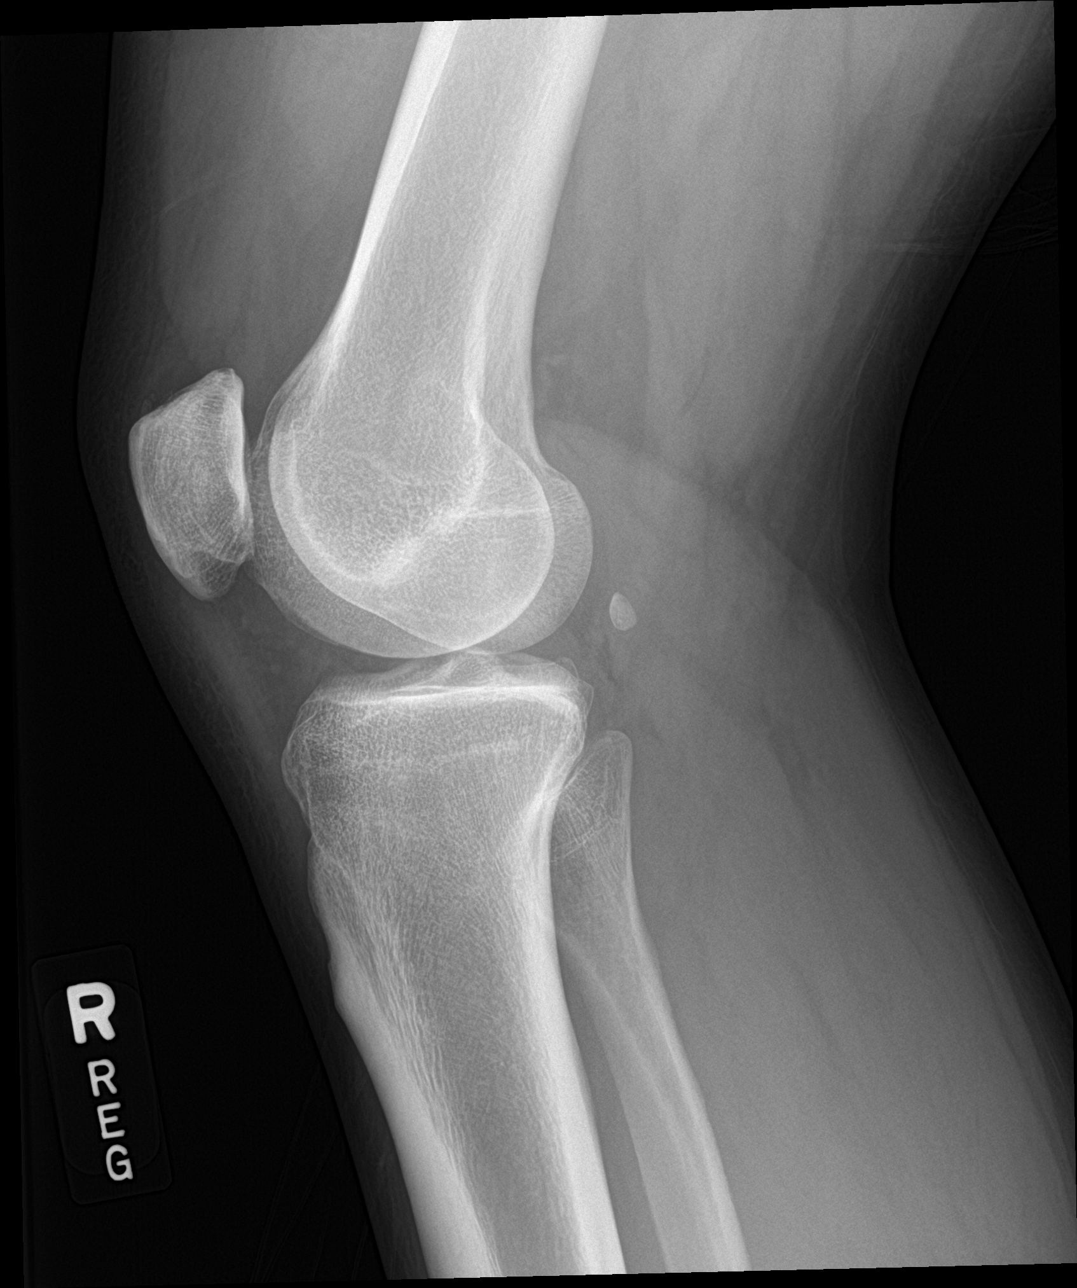

[knee obl (1 of 2)]
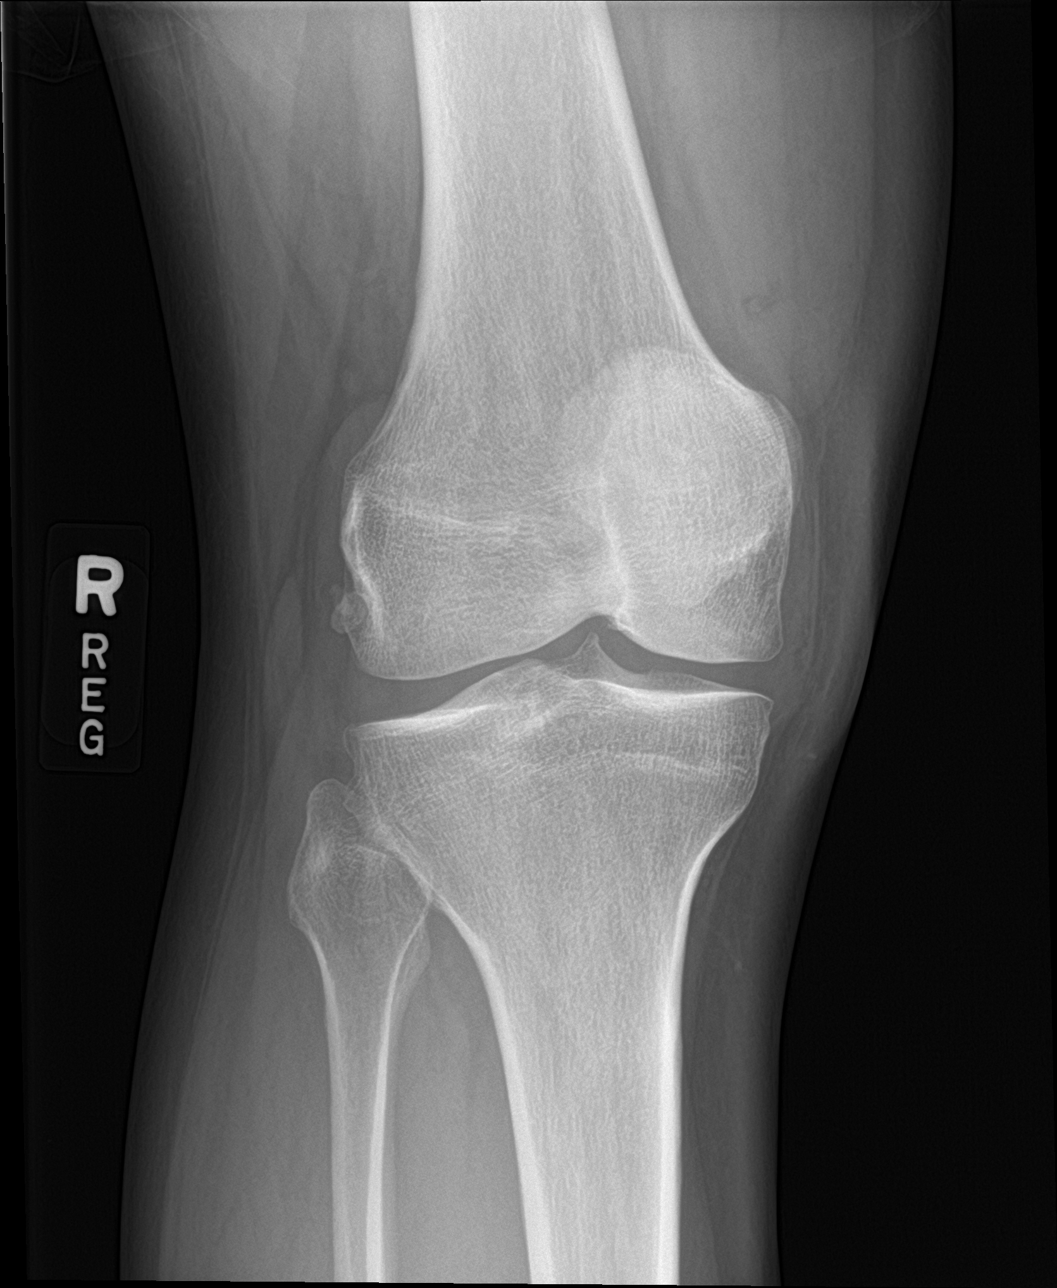

[knee obl (2 of 2)]
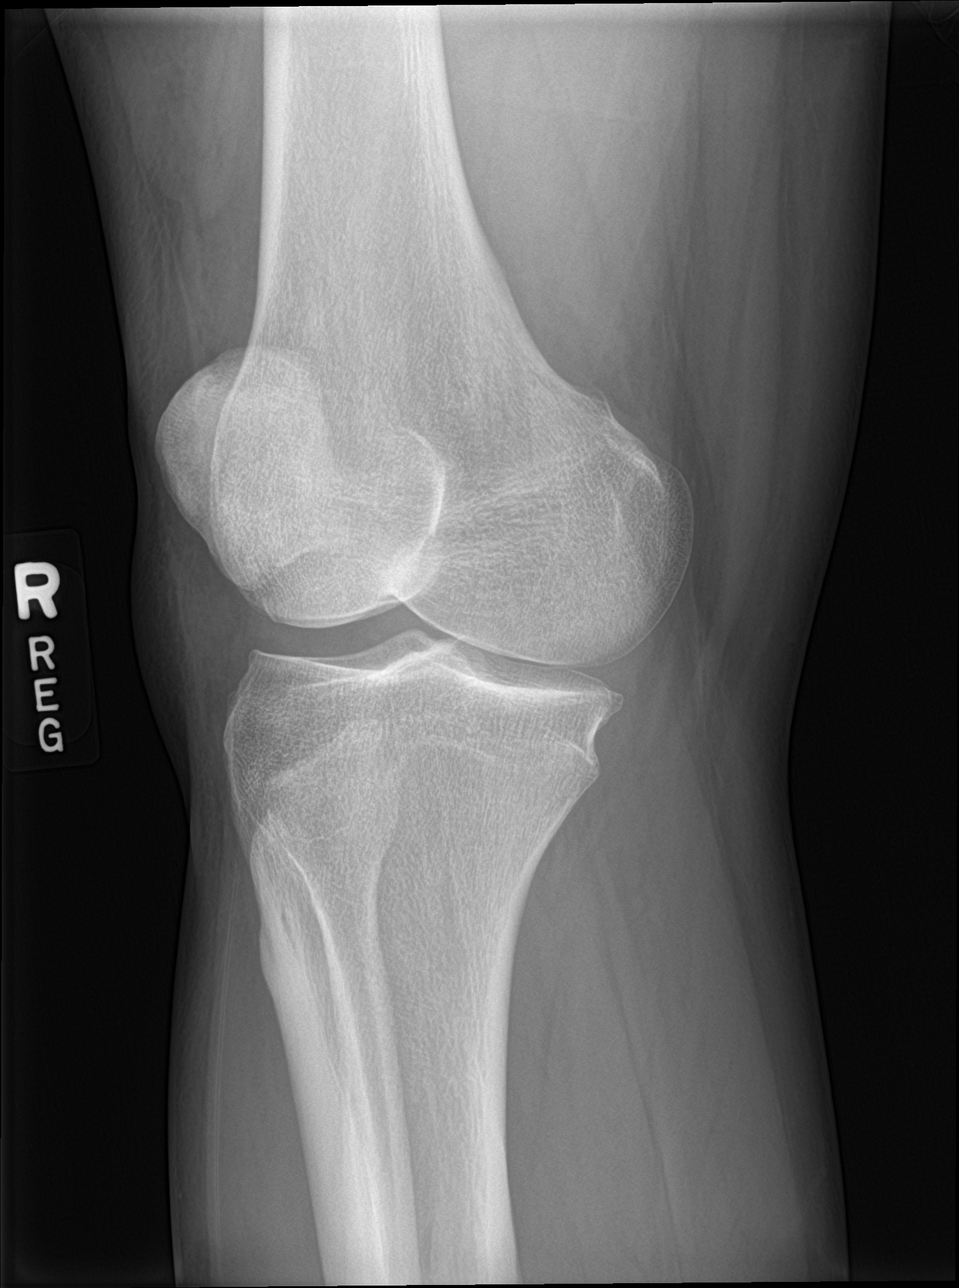

[4 of 4 positions shown; findings below may reference images not displayed]

FINDINGS: No evidence of fracture, dislocation, or joint effusion. No evidence
of arthropathy or other focal bone abnormality. Soft tissues are
unremarkable.
IMPRESSION: Negative.

## 2022-05-17 ENCOUNTER — Ambulatory Visit: Payer: Managed Care, Other (non HMO) | Admitting: Family Medicine

## 2022-05-17 ENCOUNTER — Encounter: Payer: Self-pay | Admitting: Family Medicine

## 2022-05-17 VITALS — BP 112/86 | HR 73 | Temp 97.5°F | Ht 72.0 in | Wt 269.2 lb

## 2022-05-17 DIAGNOSIS — J4520 Mild intermittent asthma, uncomplicated: Secondary | ICD-10-CM | POA: Diagnosis not present

## 2022-05-17 DIAGNOSIS — M25511 Pain in right shoulder: Secondary | ICD-10-CM

## 2022-05-17 DIAGNOSIS — R5382 Chronic fatigue, unspecified: Secondary | ICD-10-CM

## 2022-05-17 DIAGNOSIS — R29898 Other symptoms and signs involving the musculoskeletal system: Secondary | ICD-10-CM

## 2022-05-17 DIAGNOSIS — Z7689 Persons encountering health services in other specified circumstances: Secondary | ICD-10-CM

## 2022-05-17 DIAGNOSIS — R0681 Apnea, not elsewhere classified: Secondary | ICD-10-CM

## 2022-05-17 DIAGNOSIS — G8929 Other chronic pain: Secondary | ICD-10-CM

## 2022-05-17 DIAGNOSIS — G4719 Other hypersomnia: Secondary | ICD-10-CM

## 2022-05-17 MED ORDER — QVAR REDIHALER 80 MCG/ACT IN AERB: 1.0000 | INHALATION_SPRAY | Freq: Two times a day (BID) | RESPIRATORY_TRACT | 2 refills | Status: DC

## 2022-05-17 MED ORDER — MELOXICAM 7.5 MG PO TABS: 7.5000 mg | ORAL_TABLET | Freq: Every day | ORAL | 0 refills | Status: DC | PRN

## 2022-05-17 NOTE — Progress Notes (Signed)
New Patient Office Visit  Subjective    Patient ID: Darren Crawford, male    DOB: 1986-04-15  Age: 36 y.o. MRN: [id number]  CC:  Chief Complaint  Patient presents with   New Patient (Initial Visit)    Patient in office to est PCP   Shoulder Pain    Right shoulder pain - now constant with decreased ROM x 2 to 3 months ( had injury a few years ago while playing basketball and pain in R shldr would come and go but has been constant for the past 2 - 3 weeks )     HPI Darren Crawford presents to establish care Pt lives in [place]. Married with 3 children 37 y.o, 34 y.o, 62 y.o daughters.  Pt reports a few years ago in 2019, he injured his right shoulder. He says there weren't any fractures. Went to PT for this as they suspected rotator cuff injury. He used a TENS unit for this that use to help up until 2 months ago, pain won't go away even with stretches and TENS units. He says there are times he has pain that shoots up his neck. He can only raise his right shoulder to 90 degrees with abduction. He can still feel pain even when not moving but is more of a dull ache instead of sharp pain. Not using any medicines for this pain. Pain rated 7/10 without movement but with movement 10/10. Pt says he is right handed and even writing  has weakness and pain.  He has hx of asthma and using Albuterol prn. Haven't had Qvar in some years. He would like refill. He uses the Albuterol prn and says it's not daily. Using OTC zyrtec prn.  Pt has hx of left knee injury after children jumped on his leg and hyperextended his knee. He used baclofen prn for this. Didn't have to do surgery. Just used knee brace.  He is allergic to only Ibuprofen, able to tolerate Aleve and tylenol. Also allergic to PCN. He has hx of sleep apnea symptoms. He did OSA confirmed with at home sleep study. Never went back for in office study. He does snore if he lays on his back. He feels fatigued even when getting a good night's sleep. He  would like referral for this.   Outpatient Encounter Medications as of 05/17/2022  Medication Sig   albuterol (PROVENTIL) (2.5 MG/3ML) 0.083% nebulizer solution Take 2.5 mg by nebulization every 6 (six) hours as needed for wheezing or shortness of breath.   albuterol (VENTOLIN HFA) 108 (90 Base) MCG/ACT inhaler Inhale into the lungs every 6 (six) hours as needed for wheezing or shortness of breath.   beclomethasone (QVAR REDIHALER) 80 MCG/ACT inhaler Inhale 1 puff into the lungs 2 (two) times daily.   cetirizine (ZYRTEC) 10 MG chewable tablet Chew 10 mg by mouth daily.   fluticasone (FLONASE) 50 MCG/ACT nasal spray Place into both nostrils daily.   meloxicam (MOBIC) 7.5 MG tablet Take 1 tablet (7.5 mg total) by mouth daily as needed for pain.   [DISCONTINUED] baclofen (LIORESAL) 10 MG tablet Take 1 tablet (10 mg total) by mouth 3 (three) times daily.   [DISCONTINUED] beclomethasone (QVAR) 40 MCG/ACT inhaler Inhale 1 puff into the lungs daily.   [DISCONTINUED] cyclobenzaprine (FLEXERIL) 10 MG tablet Take 1 tablet (10 mg total) by mouth 3 (three) times daily as needed for muscle spasms.   [DISCONTINUED] predniSONE (DELTASONE) 20 MG tablet Take one tab by mouth twice daily for 4 days, then  one daily for 3 days. Take with food.   No facility-administered encounter medications on file as of 05/17/2022.    Past Medical History:  Diagnosis Date   Asthma    Chronic allergic rhinitis 08/03/2016   Chronic right shoulder pain    Morbid obesity (HCC) 08/03/2016   Obstructive sleep apnea syndrome 08/03/2016    No past surgical history on file.  Family History  Problem Relation Age of Onset   Healthy Mother    Idiopathic pulmonary fibrosis Father        from [employer] substances per pt    Social History   Socioeconomic History   Marital status: Married    Spouse name: Not on file   Number of children: 3   Years of education: Not on file   Highest education level: Not on file  Occupational  History   Occupation: Part Time Barrister's clerk, Part time Education officer, environmental  Tobacco Use   Smoking status: Never   Smokeless tobacco: Never  Vaping Use   Vaping Use: Never used  Substance and Sexual Activity   Alcohol use: Never   Drug use: Never   Sexual activity: Not on file  Other Topics Concern   Not on file  Social History Narrative   Not on file   Social Determinants of Health   Financial Resource Strain: Not on file  Food Insecurity: Not on file  Transportation Needs: Not on file  Physical Activity: Not on file  Stress: Not on file  Social Connections: Not on file  Intimate Partner Violence: Not on file    Review of Systems  All other systems reviewed and are negative.       Objective    BP 112/86   Pulse 73   Temp (!) 97.5 F (36.4 C)   Ht 6' (1.829 m)   Wt 269 lb 4 oz (122.1 kg)   SpO2 97%   BMI 36.52 kg/m   Physical Exam Vitals and nursing note reviewed.  Constitutional:      Appearance: Normal appearance. He is normal weight.  HENT:     Head: Normocephalic and atraumatic.     Right Ear: Tympanic membrane, ear canal and external ear normal.     Left Ear: Tympanic membrane, ear canal and external ear normal.     Nose: Nose normal.     Mouth/Throat:     Mouth: Mucous membranes are moist.     Pharynx: Oropharynx is clear.  Eyes:     Conjunctiva/sclera: Conjunctivae normal.     Pupils: Pupils are equal, round, and reactive to light.  Cardiovascular:     Rate and Rhythm: Normal rate.     Pulses: Normal pulses.     Heart sounds: Normal heart sounds.  Pulmonary:     Effort: Pulmonary effort is normal.  Abdominal:     General: Abdomen is flat.  Musculoskeletal:        General: Tenderness present.     Comments: TTP to left shoulder joint with diminished abduction to 90 degrees  Skin:    General: Skin is warm.     Capillary Refill: Capillary refill takes less than 2 seconds.  Neurological:     General: No focal deficit present.     Mental Status: He  is alert. Mental status is at baseline.     Motor: Weakness present.     Comments: Right grip strength 4/5 compared to 5/5 on left  Psychiatric:        Mood and Affect:  Mood normal.        Behavior: Behavior normal.        Thought Content: Thought content normal.        Judgment: Judgment normal.        Assessment & Plan:   Problem List Items Addressed This Visit   None Visit Diagnoses     Encounter to establish care with new doctor    -  Primary   Chronic right shoulder pain       Relevant Medications   meloxicam (MOBIC) 7.5 MG tablet   Other Relevant Orders   MR SHOULDER RIGHT WO CONTRAST   Witnessed apneic spells       Relevant Orders   Ambulatory referral to Pulmonology   Chronic fatigue       Relevant Orders   Ambulatory referral to Pulmonology   Mild intermittent asthma without complication       Relevant Medications   albuterol (VENTOLIN HFA) 108 (90 Base) MCG/ACT inhaler   beclomethasone (QVAR REDIHALER) 80 MCG/ACT inhaler   Right arm weakness       Relevant Orders   MR SHOULDER RIGHT WO CONTRAST   Excessive daytime sleepiness       Relevant Orders   Ambulatory referral to Pulmonology      Recurrent and chronic right shoulder pain. Has done imaging and PT along with continued PT exercises at his home. He has weakness in his right arm and hand. With chronic condition worsening and weakness, proceed with MRI shoulder.  Mobic prn for shoulder pain Referral back to Pulmonology for evaluation of OSA Refilled Qvar. See back in 6-8 weeks for annual. Return in about 6 weeks (around 06/28/2022) for Annual Physical.   Suzan Slick, MD

## 2022-05-20 ENCOUNTER — Encounter: Payer: Self-pay | Admitting: Family Medicine

## 2022-05-20 DIAGNOSIS — J4520 Mild intermittent asthma, uncomplicated: Secondary | ICD-10-CM

## 2022-05-23 MED ORDER — FLUTICASONE-SALMETEROL 100-50 MCG/ACT IN AEPB: 1.0000 | INHALATION_SPRAY | Freq: Two times a day (BID) | RESPIRATORY_TRACT | 5 refills | Status: DC

## 2022-05-24 ENCOUNTER — Ambulatory Visit: Payer: Managed Care, Other (non HMO)

## 2022-05-24 ENCOUNTER — Ambulatory Visit
Admission: EM | Admit: 2022-05-24 | Discharge: 2022-05-24 | Disposition: A | Discharge status: Home / Self Care | Payer: Managed Care, Other (non HMO) | Attending: Family Medicine | Admitting: Family Medicine

## 2022-05-24 ENCOUNTER — Ambulatory Visit: Admit: 2022-05-24 | Payer: Managed Care, Other (non HMO)

## 2022-05-24 DIAGNOSIS — M25561 Pain in right knee: Secondary | ICD-10-CM | POA: Diagnosis not present

## 2022-05-24 DIAGNOSIS — M25461 Effusion, right knee: Secondary | ICD-10-CM

## 2022-05-24 MED ORDER — PREDNISONE 10 MG (21) PO TBPK: ORAL_TABLET | Freq: Every day | ORAL | 0 refills | Status: DC

## 2022-05-24 NOTE — Discharge Instructions (Addendum)
Advised patient of right knee x-ray results with hard copy provided to patient.  Advised patient to RICE right knee for 30 minutes 3 times daily for the next 3 days.  Advised patient to take medications as directed with food to completion. Encouraged patient to increase daily water intake while taking these medications.  Advised patient if right knee worsens and/or unresolved please follow-up with [facility] orthopedic provider for further evaluation.  Contact information is below.

## 2022-05-24 NOTE — ED Triage Notes (Signed)
Pt c/o RT knee pain since yesterday when he injured it playing basketball. Using crutches currently. Using ice and meloxicam prn. Pain 9/10

## 2022-05-24 NOTE — ED Provider Notes (Signed)
[facility]    CSN: [id number] Arrival date & time: 05/24/22  0900      History   Chief Complaint Chief Complaint  Patient presents with   Knee Pain    RT, injury    HPI Darren Crawford is a 36 y.o. male.   HPI 36 year old male presents with right knee pain since yesterday when playing basketball.  Using crutches currently.  Reports icing and meloxicam as needed reports pain is 9 of 10.  PMH significant for morbid obesity, asthma and OSA syndrome.  Past Medical History:  Diagnosis Date   Asthma    Chronic allergic rhinitis 08/03/2016   Chronic right shoulder pain    Morbid obesity ([place]) 08/03/2016   Obstructive sleep apnea syndrome 08/03/2016    Patient Active Problem List   Diagnosis Date Noted   Tendinitis of right rotator cuff 12/13/2017   Chronic allergic rhinitis 08/03/2016   Morbid obesity ([place]) 08/03/2016   Obstructive sleep apnea syndrome 08/03/2016    History reviewed. No pertinent surgical history.     Home Medications    Prior to Admission medications   Medication Sig Start Date End Date Taking? Authorizing Provider  fluticasone-salmeterol (ADVAIR) 100-50 MCG/ACT AEPB Inhale 1 puff into the lungs 2 (two) times daily. 05/23/22 11/19/22  Leeanne Rio, MD  albuterol (PROVENTIL) (2.5 MG/3ML) 0.083% nebulizer solution Take 2.5 mg by nebulization every 6 (six) hours as needed for wheezing or shortness of breath.    [provider]  albuterol (VENTOLIN HFA) 108 (90 Base) MCG/ACT inhaler Inhale into the lungs every 6 (six) hours as needed for wheezing or shortness of breath.    [provider]  cetirizine (ZYRTEC) 10 MG chewable tablet Chew 10 mg by mouth daily.    [provider]  fluticasone (FLONASE) 50 MCG/ACT nasal spray Place into both nostrils daily.    [provider]  meloxicam (MOBIC) 7.5 MG tablet Take 1 tablet (7.5 mg total) by mouth daily as needed for pain. 05/17/22   Rucker, Nicole Kindred, MD   predniSONE (STERAPRED UNI-PAK 21 TAB) 10 MG (21) TBPK tablet Take by mouth daily. Take 6 tabs by mouth daily  for 2 days, then 5 tabs for 2 days, then 4 tabs for 2 days, then 3 tabs for 2 days, 2 tabs for 2 days, then 1 tab by mouth daily for 2 days 05/24/22  Yes Eliezer Lofts, FNP    Family History Family History  Problem Relation Age of Onset   Healthy Mother    Idiopathic pulmonary fibrosis Father        from [employer] substances per pt    Social History Social History   Tobacco Use   Smoking status: Never   Smokeless tobacco: Never  Vaping Use   Vaping Use: Never used  Substance Use Topics   Alcohol use: Never   Drug use: Never     Allergies   Ibuprofen and Penicillins   Review of Systems Review of Systems  Musculoskeletal:        Right knee pain x 1 day  All other systems reviewed and are negative.    Physical Exam Triage Vital Signs ED Triage Vitals  Enc Vitals Group     BP 05/24/22 0937 121/74     Pulse Rate 05/24/22 0937 76     Resp 05/24/22 0937 17     Temp 05/24/22 0937 97.7 F (36.5 C)     Temp Source 05/24/22 0937 Oral     SpO2  05/24/22 0937 97 %     Weight --      Height --      Head Circumference --      Peak Flow --      Pain Score 05/24/22 0936 9     Pain Loc --      Pain Edu? --      Excl. in GC? --    No data found.  Updated Vital Signs BP 121/74 (BP Location: Right Arm)   Pulse 76   Temp 97.7 F (36.5 C) (Oral)   Resp 17   SpO2 97%    Physical Exam Vitals and nursing note reviewed.  Constitutional:      General: He is not in acute distress.    Appearance: Normal appearance. He is obese. He is not ill-appearing.  HENT:     Head: Normocephalic and atraumatic.     Mouth/Throat:     Mouth: Mucous membranes are moist.     Pharynx: Oropharynx is clear.  Eyes:     Extraocular Movements: Extraocular movements intact.     Conjunctiva/sclera: Conjunctivae normal.     Pupils: Pupils are equal, round, and reactive to light.   Cardiovascular:     Rate and Rhythm: Normal rate and regular rhythm.     Pulses: Normal pulses.     Heart sounds: Normal heart sounds. No murmur heard. Pulmonary:     Effort: Pulmonary effort is normal.     Breath sounds: Normal breath sounds. No wheezing or rhonchi.  Musculoskeletal:        General: Normal range of motion.     Cervical back: Normal range of motion and neck supple.     Comments: Right knee: TTP over left superior aspect of patella with mild soft tissue swelling noted, limited range of motion with flexion/extension, exam limited due to pain today  Skin:    General: Skin is warm and dry.  Neurological:     General: No focal deficit present.     Mental Status: He is alert and oriented to person, place, and time. Mental status is at baseline.      UC Treatments / Results  Labs (all labs ordered are listed, but only abnormal results are displayed) Labs Reviewed - No data to display  EKG   Radiology DG Knee AP/LAT W/Sun[place] Right  Result Date: 05/24/2022 CLINICAL DATA:  Larey Seat onto right knee yesterday. Right knee pain and swelling. EXAM: RIGHT KNEE 3 VIEWS COMPARISON:  None Available. FINDINGS: A small knee joint effusion is seen. No evidence of fracture or dislocation. No evidence of arthropathy or other focal bone abnormality. IMPRESSION: Small knee joint effusion. No evidence of fracture. Electronically Signed   By: Danae Orleans M.D.   On: 05/24/2022 10:08    Procedures Procedures (including critical care time)  Medications Ordered in UC Medications - No data to display  Initial Impression / Assessment and Plan / UC Course  I have reviewed the triage vital signs and the nursing notes.  Pertinent labs & imaging results that were available during my care of the patient were reviewed by me and considered in my medical decision making (see chart for details).     MDM: 1.  Right knee pain-right knee x-ray revealed above.  Rx'd Cooper Render, Advised patient  of right knee x-ray results with hard copy provided to patient.  Advised patient to RICE right knee for 30 minutes 3 times daily for the next 3 days.  Advised patient to take  medications as directed with food to completion. Encouraged patient to increase daily water intake while taking these medications.  Advised patient if right knee worsens and/or unresolved please follow-up with [facility] orthopedic provider for further evaluation.  Contact information is below.  Patient discharged home, hemodynamically stable. Final Clinical Impressions(s) / UC Diagnoses   Final diagnoses:  Acute pain of right knee     Discharge Instructions      Advised patient of right knee x-ray results with hard copy provided to patient.  Advised patient to RICE right knee for 30 minutes 3 times daily for the next 3 days.  Advised patient to take medications as directed with food to completion. Encouraged patient to increase daily water intake while taking these medications.  Advised patient if right knee worsens and/or unresolved please follow-up with [facility] orthopedic provider for further evaluation.  Contact information is below.     ED Prescriptions     Medication Sig Dispense Auth. Provider   predniSONE (STERAPRED UNI-PAK 21 TAB) 10 MG (21) TBPK tablet Take by mouth daily. Take 6 tabs by mouth daily  for 2 days, then 5 tabs for 2 days, then 4 tabs for 2 days, then 3 tabs for 2 days, 2 tabs for 2 days, then 1 tab by mouth daily for 2 days 42 tablet Eliezer Lofts, FNP      PDMP not reviewed this encounter.   Eliezer Lofts, [place] 05/24/22 1041

## 2022-06-08 ENCOUNTER — Telehealth: Payer: Self-pay

## 2022-06-08 NOTE — Telephone Encounter (Signed)
FYI -   Insurance denied auth for MRI study. The following information is required to determine if study is medically necessary.   Per insurance:  Requesting current signs and symptoms indicating the exam. Prior dx studies with results. Prior management. Medications with dose and duration.   Patient has not completed 6 weeks of provider directed treatment. The provider directed treatment did not occur within the last three months.  Symptoms must be the same or worse after treatment to support imaging. There was no contact with your provider after completing treatment.  Visit summaries, med list and treatment plan were faxed to the insurance per medical reviewer request. The information submitted to insurance was not sufficient.   Provider can appeal at [phone]. Case/Ref # [id number].

## 2022-06-08 NOTE — Telephone Encounter (Signed)
Noted and aware. I have already advised pt that MRI likely would be denied.  Please contact pt and inform him of denial. Would he like to see Orthopedic at this time?

## 2022-06-09 NOTE — Telephone Encounter (Signed)
Attempted call to patient. Left voice mail requesting a return call.  

## 2022-06-10 NOTE — Telephone Encounter (Signed)
Please refer to Dr T for chronic right shoulder pain

## 2022-06-10 NOTE — Telephone Encounter (Signed)
Patient informed and states he would like to go ahead with orthopaedic referral.  Do you want to have him set up with Dr. Karie Schwalbe ?

## 2022-06-10 NOTE — Telephone Encounter (Signed)
Please contact patient to schedule appointment with Dr. Karie Schwalbe for chronic right shoulder pain  per Dr. Wyline Mood.

## 2022-06-15 ENCOUNTER — Other Ambulatory Visit: Payer: Self-pay | Admitting: Family Medicine

## 2022-06-15 DIAGNOSIS — G8929 Other chronic pain: Secondary | ICD-10-CM

## 2022-06-16 ENCOUNTER — Ambulatory Visit: Payer: Managed Care, Other (non HMO) | Admitting: Sports Medicine

## 2022-06-16 ENCOUNTER — Ambulatory Visit: Payer: Managed Care, Other (non HMO)

## 2022-06-16 DIAGNOSIS — S8991XA Unspecified injury of right lower leg, initial encounter: Secondary | ICD-10-CM | POA: Insufficient documentation

## 2022-06-16 DIAGNOSIS — M25511 Pain in right shoulder: Secondary | ICD-10-CM | POA: Diagnosis not present

## 2022-06-16 DIAGNOSIS — S43431A Superior glenoid labrum lesion of right shoulder, initial encounter: Secondary | ICD-10-CM

## 2022-06-16 DIAGNOSIS — G8929 Other chronic pain: Secondary | ICD-10-CM | POA: Diagnosis not present

## 2022-06-16 NOTE — Assessment & Plan Note (Signed)
Also had a fall while playing basketball directly on the kneecap about 2 weeks ago, was seen in urgent care where x-rays were obtained and read as negative although on my personal review I do see some incongruity of the lateral tibial plateau. On exam he still has a mild effusion, slightly loose ACL and tenderness/pain to terminal flexion, differential includes ACL disruption, osteochondral injury, meniscal injury and tibial plateau fracture. If this is not better in a couple of weeks we will proceed with MRI of the right knee.

## 2022-06-16 NOTE — Assessment & Plan Note (Signed)
This is a pleasant 37 year old male, he has had several years of pain at right shoulder anterior aspect, he recalls throwing a basketball, feeling a grinding sensation and pain, subsequently he has had on and off pain anterior joint line with mechanical symptoms such as popping and catching. On exam he has a positive O'Brien's test, positive speeds test, concerning for labral injury . Other rotator cuff impingement signs are negative. Considering duration of pain, signs on exam I am concerned he has a labral tear, we will proceed with updated x-rays as well as MR arthrography, I will see him back for the ultrasound-guided gadolinium arthrogram injection. I did describe the anatomy and pathophysiology to him.

## 2022-06-16 NOTE — Progress Notes (Signed)
    Procedures performed today:    None.  Independent interpretation of notes and tests performed by another provider:   I did personally review his knee x-rays that show some incongruity of the lateral tibial plateau.  Shoulder x-rays from 3 years ago do show a calcification superior glenoid.  Brief History, Exam, Impression, and Recommendations:    Labral tear of shoulder, right, initial encounter This is a pleasant 37 year old male, he has had several years of pain at right shoulder anterior aspect, he recalls throwing a basketball, feeling a grinding sensation and pain, subsequently he has had on and off pain anterior joint line with mechanical symptoms such as popping and catching. On exam he has a positive O'Brien's test, positive speeds test, concerning for labral injury . Other rotator cuff impingement signs are negative. Considering duration of pain, signs on exam I am concerned he has a labral tear, we will proceed with updated x-rays as well as MR arthrography, I will see him back for the ultrasound-guided gadolinium arthrogram injection. I did describe the anatomy and pathophysiology to him.  Right knee injury Also had a fall while playing basketball directly on the kneecap about 2 weeks ago, was seen in urgent care where x-rays were obtained and read as negative although on my personal review I do see some incongruity of the lateral tibial plateau. On exam he still has a mild effusion, slightly loose ACL and tenderness/pain to terminal flexion, differential includes ACL disruption, osteochondral injury, meniscal injury and tibial plateau fracture. If this is not better in a couple of weeks we will proceed with MRI of the right knee.    ____________________________________________ Gwen Her. Dianah Field, M.D., ABFM., CAQSM., AME. Primary Care and Sports Medicine [facility]  Adjunct Professor of [employer]  of Medicine  Risk manager

## 2022-06-28 ENCOUNTER — Ambulatory Visit: Payer: Managed Care, Other (non HMO) | Admitting: Family Medicine

## 2022-07-05 ENCOUNTER — Ambulatory Visit: Payer: Managed Care, Other (non HMO) | Admitting: Sports Medicine

## 2022-07-07 ENCOUNTER — Encounter: Payer: Self-pay | Admitting: Sports Medicine

## 2022-07-07 DIAGNOSIS — S43431A Superior glenoid labrum lesion of right shoulder, initial encounter: Secondary | ICD-10-CM

## 2022-09-01 ENCOUNTER — Encounter: Payer: Managed Care, Other (non HMO) | Admitting: Family Medicine

## 2022-09-02 ENCOUNTER — Encounter: Payer: Self-pay | Admitting: Family Medicine

## 2022-09-02 ENCOUNTER — Ambulatory Visit (INDEPENDENT_AMBULATORY_CARE_PROVIDER_SITE_OTHER): Payer: Managed Care, Other (non HMO) | Admitting: Family Medicine

## 2022-09-02 VITALS — BP 117/73 | HR 78 | Temp 97.6°F | Resp 18 | Ht 72.0 in | Wt 272.0 lb

## 2022-09-02 DIAGNOSIS — M545 Low back pain, unspecified: Secondary | ICD-10-CM

## 2022-09-02 DIAGNOSIS — R002 Palpitations: Secondary | ICD-10-CM

## 2022-09-02 DIAGNOSIS — Z0000 Encounter for general adult medical examination without abnormal findings: Secondary | ICD-10-CM

## 2022-09-02 DIAGNOSIS — G8929 Other chronic pain: Secondary | ICD-10-CM

## 2022-09-02 DIAGNOSIS — R5382 Chronic fatigue, unspecified: Secondary | ICD-10-CM | POA: Diagnosis not present

## 2022-09-02 DIAGNOSIS — Z1159 Encounter for screening for other viral diseases: Secondary | ICD-10-CM

## 2022-09-02 DIAGNOSIS — R7302 Impaired glucose tolerance (oral): Secondary | ICD-10-CM | POA: Diagnosis not present

## 2022-09-02 DIAGNOSIS — Z13220 Encounter for screening for lipoid disorders: Secondary | ICD-10-CM

## 2022-09-02 DIAGNOSIS — R197 Diarrhea, unspecified: Secondary | ICD-10-CM

## 2022-09-02 NOTE — Progress Notes (Signed)
Complete physical exam  Patient: Darren Crawford   DOB: 07-03-1985   37 y.o. Male  MRN: [id number]  Subjective:    Chief Complaint  Patient presents with   Annual Exam    Patient states that he has been having some issues with what feels like heart palpitations, he says that is happening more often he states  that at times its almost as if he sees spots before his eyes accompained with some shortness of breath He also states that he has had some diarrhea off and on for the past 3 weeks     Darren Crawford is a 37 y.o. male who presents today for a complete physical exam. He reports consuming a general diet. The patient does not participate in regular exercise at present. He generally feels well. He reports sleeping well. He does have additional problems to discuss today.  Pt reports he's having palpitations throughout the day. He reports when it occurs, it can be during work. 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 He does report some stress and panic at times. He also reports diarrhea 3 weeks on and off. Started before the palpitations. He reports one episode he had blood in the toilet but this happened 2 weeks ago.  Pt reports recurrent  back issues where he has issues trying to lose weight but hard to be active because he's scared to 'slip his back again'. Sometimes he has pain in the right leg when he has exacerbations.  Most recent fall risk assessment:    05/17/2022   10:27 AM  [place] in the past year? 0  Number falls in past yr: 0  Injury with Fall? 1     Most recent depression screenings:    05/17/2022   10:20 AM  PHQ 2/9 Scores  PHQ - 2 Score 1  PHQ- 9 Score 11    Vision:Within last year  Patient Active Problem List   Diagnosis Date Noted   Right knee injury 06/16/2022   Labral tear of shoulder, right, initial encounter 12/13/2017   Chronic allergic rhinitis 08/03/2016   Morbid obesity ([place]) 08/03/2016   Obstructive sleep apnea syndrome 08/03/2016   Past  Medical History:  Diagnosis Date   Asthma    Chronic allergic rhinitis 08/03/2016   Chronic right shoulder pain    Morbid obesity ([place]) 08/03/2016   Obstructive sleep apnea syndrome 08/03/2016      Patient Care Team: Leeanne Rio, MD as PCP - General (Family Medicine) Silverio Decamp, MD as Consulting Physician (Sports Medicine)   Outpatient Medications Prior to Visit  Medication Sig   albuterol (PROVENTIL) (2.5 MG/3ML) 0.083% nebulizer solution Take 2.5 mg by nebulization every 6 (six) hours as needed for wheezing or shortness of breath.   albuterol (VENTOLIN HFA) 108 (90 Base) MCG/ACT inhaler Inhale into the lungs every 6 (six) hours as needed for wheezing or shortness of breath.   cetirizine (ZYRTEC) 10 MG chewable tablet Chew 10 mg by mouth daily.   fluticasone (FLONASE) 50 MCG/ACT nasal spray Place into both nostrils daily.   fluticasone-salmeterol (ADVAIR) 100-50 MCG/ACT AEPB Inhale 1 puff into the lungs 2 (two) times daily.   meloxicam (MOBIC) 7.5 MG tablet TAKE 1 TABLET BY MOUTH DAILY AS NEEDED FOR PAIN   predniSONE (STERAPRED UNI-PAK 21 TAB) 10 MG (21) TBPK tablet Take by mouth daily. Take 6 tabs by mouth daily  for 2 days, then 5 tabs for 2 days, then 4 tabs for 2  days, then 3 tabs for 2 days, 2 tabs for 2 days, then 1 tab by mouth daily for 2 days   No facility-administered medications prior to visit.    Review of Systems  Constitutional:  Positive for malaise/fatigue.  Cardiovascular:  Positive for palpitations.  Gastrointestinal:  Positive for diarrhea.  Musculoskeletal:  Positive for back pain.  All other systems reviewed and are negative.        Objective:     BP 117/73   Pulse 78   Temp 97.6 F (36.4 C) (Oral)   Resp 18   Ht 6' (1.829 m)   Wt 272 lb (123.4 kg)   SpO2 97%   BMI 36.89 kg/m  BP Readings from Last 3 Encounters:  09/02/22 117/73  05/24/22 121/74  05/17/22 112/86      Physical Exam Vitals and nursing note reviewed.   Constitutional:      Appearance: Normal appearance. He is obese.  HENT:     Head: Normocephalic and atraumatic.     Right Ear: Tympanic membrane, ear canal and external ear normal.     Left Ear: Tympanic membrane, ear canal and external ear normal.     Nose: Nose normal.     Mouth/Throat:     Mouth: Mucous membranes are moist.     Pharynx: Oropharynx is clear.  Eyes:     Conjunctiva/sclera: Conjunctivae normal.     Pupils: Pupils are equal, round, and reactive to light.  Cardiovascular:     Rate and Rhythm: Normal rate and regular rhythm.     Pulses: Normal pulses.     Heart sounds: Normal heart sounds.  Pulmonary:     Effort: Pulmonary effort is normal.     Breath sounds: Normal breath sounds.  Abdominal:     General: Abdomen is flat. Bowel sounds are normal.  Musculoskeletal:        General: Normal range of motion.  Skin:    General: Skin is warm.     Capillary Refill: Capillary refill takes less than 2 seconds.  Neurological:     General: No focal deficit present.     Mental Status: He is alert and oriented to person, place, and time. Mental status is at baseline.  Psychiatric:        Mood and Affect: Mood normal.        Behavior: Behavior normal.        Thought Content: Thought content normal.        Judgment: Judgment normal.     No results found for any visits on 09/02/22.      Assessment & Plan:    Routine Health Maintenance and Physical Exam   There is no immunization history on file for this patient.  Health Maintenance  Topic Date Due   COVID-19 Vaccine (1) Never done   HIV Screening  Never done   Hepatitis C Screening  Never done   INFLUENZA VACCINE  09/11/2022 (Originally 01/11/2022)   HPV VACCINES  Aged Out   DTaP/Tdap/Td  Discontinued    Discussed health benefits of physical activity, and encouraged him to engage in regular exercise appropriate for his age and condition.  Problem List Items Addressed This Visit   None  No follow-ups on  file. Annual physical exam  Encounter for lipid screening for cardiovascular disease  Impaired glucose tolerance -     CBC with Differential/Platelet -     Comprehensive metabolic panel -     Hemoglobin A1c  Need for hepatitis  C screening test -     Hepatitis C antibody  Screening for viral disease -     HIV Antibody (routine testing w rflx)  Chronic fatigue -     TSH -     T4, free -     Magnesium  Chronic midline low back pain without sciatica  Diarrhea, unspecified type  Palpitations   Screening labs Discussed possible causes of diarrhea, less likely infectious due to 3 week history. Could be IBS as he does have  a lot of stressors he's dealing with currently. This started before palpitations. To check electrolytes as low potassium from diarrhea could cause palpitations.  Discussed back pain likely DDD and he's having exacerbations. Discussed home care treatment for this during exacerbations.      Leeanne Rio, MD

## 2022-09-03 LAB — CBC WITH DIFFERENTIAL/PLATELET
Basophils Absolute: 0.1 10*3/uL (ref 0.0–0.2)
Basos: 1 %
EOS (ABSOLUTE): 0.2 10*3/uL (ref 0.0–0.4)
Eos: 4 %
Hematocrit: 46.7 % (ref 37.5–51.0)
Hemoglobin: 15.7 g/dL (ref 13.0–17.7)
Immature Grans (Abs): 0 10*3/uL (ref 0.0–0.1)
Immature Granulocytes: 0 %
Lymphocytes Absolute: 2.2 10*3/uL (ref 0.7–3.1)
Lymphs: 42 %
MCH: 31.1 pg (ref 26.6–33.0)
MCHC: 33.6 g/dL (ref 31.5–35.7)
MCV: 93 fL (ref 79–97)
Monocytes Absolute: 0.5 10*3/uL (ref 0.1–0.9)
Monocytes: 10 %
Neutrophils Absolute: 2.3 10*3/uL (ref 1.4–7.0)
Neutrophils: 43 %
Platelets: 297 10*3/uL (ref 150–450)
RBC: 5.05 x10E6/uL (ref 4.14–5.80)
RDW: 12.9 % (ref 11.6–15.4)
WBC: 5.3 10*3/uL (ref 3.4–10.8)

## 2022-09-03 LAB — HEPATITIS C ANTIBODY: Hep C Virus Ab: NONREACTIVE

## 2022-09-03 LAB — HEMOGLOBIN A1C
Est. average glucose Bld gHb Est-mCnc: 105 mg/dL
Hgb A1c MFr Bld: 5.3 % (ref 4.8–5.6)

## 2022-09-03 LAB — HIV ANTIBODY (ROUTINE TESTING W REFLEX): HIV Screen 4th Generation wRfx: NONREACTIVE

## 2022-09-03 LAB — COMPREHENSIVE METABOLIC PANEL
ALT: 55 IU/L — ABNORMAL HIGH (ref 0–44)
AST: 28 IU/L (ref 0–40)
Albumin/Globulin Ratio: 1.4 (ref 1.2–2.2)
Albumin: 4.3 g/dL (ref 4.1–5.1)
Alkaline Phosphatase: 48 IU/L (ref 44–121)
BUN/Creatinine Ratio: 14 (ref 9–20)
BUN: 13 mg/dL (ref 6–20)
Bilirubin Total: 0.6 mg/dL (ref 0.0–1.2)
CO2: 24 mmol/L (ref 20–29)
Calcium: 9.8 mg/dL (ref 8.7–10.2)
Chloride: 101 mmol/L (ref 96–106)
Creatinine, Ser: 0.96 mg/dL (ref 0.76–1.27)
Globulin, Total: 3.1 g/dL (ref 1.5–4.5)
Glucose: 88 mg/dL (ref 70–99)
Potassium: 4.4 mmol/L (ref 3.5–5.2)
Sodium: 138 mmol/L (ref 134–144)
Total Protein: 7.4 g/dL (ref 6.0–8.5)
eGFR: 104 mL/min/{1.73_m2} (ref >59)

## 2022-09-03 LAB — T4, FREE: Free T4: 1.27 ng/dL (ref 0.82–1.77)

## 2022-09-03 LAB — MAGNESIUM: Magnesium: 2.1 mg/dL (ref 1.6–2.3)

## 2022-09-03 LAB — TSH: TSH: 0.952 u[IU]/mL (ref 0.450–4.500)

## 2022-09-05 ENCOUNTER — Ambulatory Visit: Payer: Managed Care, Other (non HMO)

## 2022-09-05 ENCOUNTER — Ambulatory Visit (INDEPENDENT_AMBULATORY_CARE_PROVIDER_SITE_OTHER): Payer: Managed Care, Other (non HMO)

## 2022-09-05 ENCOUNTER — Ambulatory Visit: Payer: Managed Care, Other (non HMO) | Admitting: Sports Medicine

## 2022-09-05 DIAGNOSIS — S43431A Superior glenoid labrum lesion of right shoulder, initial encounter: Secondary | ICD-10-CM

## 2022-09-05 MED ORDER — TRIAMCINOLONE ACETONIDE 40 MG/ML IJ SUSP: 40.0000 mg | Freq: Once | INTRAMUSCULAR | Status: AC

## 2022-09-05 NOTE — Progress Notes (Signed)
    Procedures performed today:    Procedure: Real-time Ultrasound Guided gadolinium contrast injection of right glenohumeral joint Device: Samsung HS60  Verbal informed consent obtained.  Time-out conducted.  Noted no overlying erythema, induration, or other signs of local infection.  Skin prepped in a sterile fashion.  Local anesthesia: Topical Ethyl chloride.  With sterile technique and under real time ultrasound guidance: Noted normal-appearing joint, 1 cc kenalog 40, 2 cc lidocaine, 2 cc bupivacaine injected, syringe switched and 0.1 cc gadolinium injected, syringe again switched and 10 cc sterile saline used to fully distend the joint. Joint visualized and capsule seen distending confirming intra-articular placement of contrast material and medication. Completed without difficulty  Advised to call if fevers/chills, erythema, induration, drainage, or persistent bleeding.  Images permanently stored in PACS Impression: Technically successful ultrasound guided gadolinium contrast injection for MR arthrography.  Please see separate MR arthrogram report.  Independent interpretation of notes and tests performed by another provider:   None.  Brief History, Exam, Impression, and Recommendations:    Labral tear of shoulder, right, initial encounter Injection for MR arthrography.    ____________________________________________ Gwen Her. Dianah Field, M.D., ABFM., CAQSM., AME. Primary Care and Sports Medicine [facility]  Adjunct Professor of [employer] [facility] [employer]  Risk manager

## 2022-09-05 NOTE — Assessment & Plan Note (Signed)
Injection for MR arthrography.

## 2022-09-20 ENCOUNTER — Encounter: Payer: Self-pay | Admitting: Sports Medicine

## 2022-09-20 DIAGNOSIS — S43431A Superior glenoid labrum lesion of right shoulder, initial encounter: Secondary | ICD-10-CM

## 2022-10-28 ENCOUNTER — Other Ambulatory Visit: Payer: Self-pay

## 2022-10-28 ENCOUNTER — Encounter (HOSPITAL_BASED_OUTPATIENT_CLINIC_OR_DEPARTMENT_OTHER): Payer: Self-pay | Admitting: Orthopaedic Surgery

## 2022-11-02 NOTE — Discharge Instructions (Signed)
Ramond Marrow MD, MPH Alfonse Alpers, PA-C [facility] [address] [phone] (tel)   [phone] (fax)   POST-OPERATIVE INSTRUCTIONS - SHOULDER ARTHROSCOPY  WOUND CARE You may remove the Operative Dressing on Post-Op Day #3 (72hrs after surgery).   Alternatively if you would like you can leave dressing on until follow-up if within 7-8 days but keep it dry. Leave steri-strips in place until they fall off on their own, usually 2 weeks postop. There may be a small amount of fluid/bleeding leaking at the surgical site.  This is normal; the shoulder is filled with fluid during the procedure and can leak for 24-48hrs after surgery.  You may change/reinforce the bandage as needed.  Use the Cryocuff or Ice as often as possible for the first 7 days, then as needed for pain relief. Always keep a towel, ACE wrap or other barrier between the cooling unit and your skin.  You may shower on Post-Op Day #3. Gently pat the area dry.  Do not soak the shoulder in water or submerge it.  Keep incisions as dry as possible. Do not go swimming in the pool or ocean until 4 weeks after surgery or when otherwise instructed.    EXERCISES Wear the sling at all times  You may remove the sling for showering, but keep the arm across the chest or in a secondary sling.     It is normal for your fingers/hand to become more swollen after surgery and discolored from bruising.   This will resolve over the first few weeks usually after surgery. Please continue to ambulate and do not stay sitting or lying for too long.  Perform foot and wrist pumps to assist in circulation.  PHYSICAL THERAPY - You will begin physical therapy soon after surgery (unless otherwise specified)  - A PT referral was sent to [facility] in [place]  - Please call to set up an appointment, if you do not already have one   REGIONAL ANESTHESIA (NERVE BLOCKS) The anesthesia team may have performed  a nerve block for you this is a great tool used to minimize pain.   The block may start wearing off overnight (between 8-24 hours postop) When the block wears off, your pain may go from nearly zero to the pain you would have had postop without the block. This is an abrupt transition but nothing dangerous is happening.   This can be a challenging period but utilize your as needed pain medications to try and manage this period. We suggest you use the pain medication the first night prior to going to bed, to ease this transition.  You may take an extra dose of narcotic when this happens if needed  POST-OP MEDICATIONS- Multimodal approach to pain control In general your pain will be controlled with a combination of substances.  Prescriptions unless otherwise discussed are electronically sent to your pharmacy.  This is a carefully made plan we use to minimize narcotic use.     Meloxicam - Anti-inflammatory medication taken on a scheduled basis Acetaminophen - Non-narcotic pain medicine taken on a scheduled basis  Oxycodone - This is a strong narcotic, to be used only on an "as needed" basis for SEVERE pain. Zofran - take as needed for nausea  FOLLOW-UP If you develop a Fever (?101.5), Redness or Drainage from the surgical incision site, please call our office to arrange for an evaluation. Please call the office to schedule a follow-up appointment for your first post-operative appointment,  7-10 days post-operatively.    HELPFUL INFORMATION   You may be more comfortable sleeping in a semi-seated position the first few nights following surgery.  Keep a pillow propped under the elbow and forearm for comfort.  If you have a recliner type of chair it might be beneficial.  If not that is fine too, but it would be helpful to sleep propped up with pillows behind your operated shoulder as well under your elbow and forearm.  This will reduce pulling on the suture lines.  When dressing, put your operative  arm in the sleeve first.  When getting undressed, take your operative arm out last.  Loose fitting, button-down shirts are recommended.  Often in the first days after surgery you may be more comfortable keeping your operative arm under your shirt and not through the sleeve.  You may return to work/school in the next couple of days when you feel up to it.  Desk work and typing in the sling is fine.  We suggest you use the pain medication the first night prior to going to bed, in order to ease any pain when the anesthesia wears off. You should avoid taking pain medications on an empty stomach as it will make you nauseous.  You should wean off your narcotic medicines as soon as you are able.  Most patients will be off narcotics before their first postop appointment.   Do not drink alcoholic beverages or take illicit drugs when taking pain medications.  It is against the law to drive while taking narcotics.  In some states it is against the law to drive while your arm is in a sling.   Pain medication may make you constipated.  Below are a few solutions to try in this order: Decrease the amount of pain medication if you aren't having pain. Drink lots of decaffeinated fluids. Drink prune juice and/or eat dried prunes  If the first 3 don't work start with additional solutions Take Colace - an over-the-counter stool softener Take Senokot - an over-the-counter laxative Take Miralax - a stronger over-the-counter laxative  For more information including helpful videos and documents visit our website:   [email or web address]-[email or web address]   No Tylenol until 2:30pm   Post Anesthesia Home Care Instructions  Activity: Get plenty of rest for the remainder of the day. A responsible individual must stay with you for 24 hours following the procedure.  For the next 24 hours, DO NOT: -Drive a car -Advertising copywriter -Drink alcoholic beverages -Take any medication unless instructed by  your physician -Make any legal decisions or sign important papers.  Meals: Start with liquid foods such as gelatin or soup. Progress to regular foods as tolerated. Avoid greasy, spicy, heavy foods. If nausea and/or vomiting occur, drink only clear liquids until the nausea and/or vomiting subsides. Call your physician if vomiting continues.  Special Instructions/Symptoms: Your throat may feel dry or sore from the anesthesia or the breathing tube placed in your throat during surgery. If this causes discomfort, gargle with warm salt water. The discomfort should disappear within 24 hours.  If you had a scopolamine patch placed behind your ear for the management of post- operative nausea and/or vomiting:  1. The medication in the patch is effective for 72 hours, after which it should be removed.  Wrap patch in a tissue and discard in the trash. Wash hands thoroughly with soap and water. 2. You may remove the patch earlier than 72 hours if you experience unpleasant side effects which may include  dry mouth, dizziness or visual disturbances. 3. Avoid touching the patch. Wash your hands with soap and water after contact with the patch.   Regional Anesthesia Blocks  1. Numbness or the inability to move the "blocked" extremity may last from 3-48 hours after placement. The length of time depends on the medication injected and your individual response to the medication. If the numbness is not going away after 48 hours, call your surgeon.  2. The extremity that is blocked will need to be protected until the numbness is gone and the  Strength has returned. Because you cannot feel it, you will need to take extra care to avoid injury. Because it may be weak, you may have difficulty moving it or using it. You may not know what position it is in without looking at it while the block is in effect.  3. For blocks in the legs and feet, returning to weight bearing and walking needs to be done carefully. You will need  to wait until the numbness is entirely gone and the strength has returned. You should be able to move your leg and foot normally before you try and bear weight or walk. You will need someone to be with you when you first try to ensure you do not fall and possibly risk injury.  4. Bruising and tenderness at the needle site are common side effects and will resolve in a few days.  5. Persistent numbness or new problems with movement should be communicated to the surgeon or the [facility] [phone] [facility] [phone]).Information for Discharge Teaching: EXPAREL (bupivacaine liposome injectable suspension)   Your surgeon or anesthesiologist gave you EXPAREL(bupivacaine) to help control your pain after surgery.  EXPAREL is a local anesthetic that provides pain relief by numbing the tissue around the surgical site. EXPAREL is designed to release pain medication over time and can control pain for up to 72 hours. Depending on how you respond to EXPAREL, you may require less pain medication during your recovery.  Possible side effects: Temporary loss of sensation or ability to move in the area where bupivacaine was injected. Nausea, vomiting, constipation Rarely, numbness and tingling in your mouth or lips, lightheadedness, or anxiety may occur. Call your doctor right away if you think you may be experiencing any of these sensations, or if you have other questions regarding possible side effects.  Follow all other discharge instructions given to you by your surgeon or nurse. Eat a healthy diet and drink plenty of water or other fluids.  If you return to the hospital for any reason within 96 hours following the administration of EXPAREL, it is important for health care providers to know that you have received this anesthetic. A teal colored band has been placed on your arm with the date, time and amount of EXPAREL you have received in order to alert and inform your  health care providers. Please leave this armband in place for the full 96 hours following administration, and then you may remove the band.

## 2022-11-02 NOTE — H&P (Signed)
PREOPERATIVE H&P  Chief Complaint: RIGHT SHOULDER CARTILAGE DISORDER,OA, IMPINGEMENT SYNDROME,BICEPS TENDIDITS.  HPI: Darren Crawford is a 37 y.o. male who is scheduled for, Procedure(s): SHOULDER ARTHROSCOPY WITH SUBACROMIAL DECOMPRESSION AND BICEP TENDON REPAIR SHOULDER ARTHROSCOPY WITH DISTAL CLAVICLE EXCISION.   Patient has a past medical history significant for asthma.   The patient is a 37 year old with a shoulder injury five years ago that has drastically gotten more painful.  He has trouble with overhead activity.  He has pain throwing with his children.  He works as a Education officer, environmental and a Barrister's clerk.  He is right-hand dominant at baseline.  Symptoms are rated as moderate to severe, and have been worsening.  This is significantly impairing activities of daily living.    Please see clinic note for further details on this patient's care.    He has elected for surgical management.   Past Medical History:  Diagnosis Date   Anxiety    Asthma    Chronic allergic rhinitis 08/03/2016   Chronic right shoulder pain    Morbid obesity (HCC) 08/03/2016   Obstructive sleep apnea syndrome 08/03/2016   Past Surgical History:  Procedure Laterality Date   HERNIA REPAIR     TOE SURGERY     Social History   Socioeconomic History   Marital status: Married    Spouse name: Katie   Number of children: 3   Years of education: Not on file   Highest education level: Master's degree (e.g., MA, MS, MEng, MEd, MSW, MBA)  Occupational History   Occupation: Part Time Barrister's clerk, Part time Education officer, environmental  Tobacco Use   Smoking status: Never   Smokeless tobacco: Never  Vaping Use   Vaping Use: Never used  Substance and Sexual Activity   Alcohol use: Never   Drug use: Never   Sexual activity: Not on file  Other Topics Concern   Not on file  Social History Narrative   Not on file   Social Determinants of Health   Financial Resource Strain: Medium Risk (09/04/2022)   Overall Financial  Resource Strain (CARDIA)    Difficulty of Paying Living Expenses: Somewhat hard  Food Insecurity: Food Insecurity Present (09/04/2022)   Hunger Vital Sign    Worried About Running Out of Food in the Last Year: Sometimes true    Ran Out of Food in the Last Year: Sometimes true  Transportation Needs: No Transportation Needs (09/04/2022)   PRAPARE - Administrator, Civil Service (Medical): No    Lack of Transportation (Non-Medical): No  Physical Activity: Insufficiently Active (09/04/2022)   Exercise Vital Sign    Days of Exercise per Week: 2 days    Minutes of Exercise per Session: 20 min  Stress: Stress Concern Present (09/04/2022)   [employer] of Occupational Health - Occupational Stress Questionnaire    Feeling of Stress : To some extent  Social Connections: Socially Integrated (09/04/2022)   Social Connection and Isolation Panel [NHANES]    Frequency of Communication with Friends and Family: Twice a week    Frequency of Social Gatherings with Friends and Family: Twice a week    Attends Religious Services: More than 4 times per year    Active Member of [employer] or Organizations: Yes    Attends Engineer, structural: More than 4 times per year    Marital Status: Married   Family History  Problem Relation Age of Onset   Healthy Mother    Idiopathic pulmonary fibrosis Father  from [employer] substances per pt   Allergies  Allergen Reactions   Ibuprofen Rash   Penicillins Rash   Prior to Admission medications   Medication Sig Start Date End Date Taking? Authorizing Provider  cetirizine (ZYRTEC) 10 MG chewable tablet Chew 10 mg by mouth daily.   Yes [provider]  fluticasone (FLONASE) 50 MCG/ACT nasal spray Place into both nostrils daily.   Yes [provider]  fluticasone-salmeterol (ADVAIR) 100-50 MCG/ACT AEPB Inhale 1 puff into the lungs 2 (two) times daily. 05/23/22 11/19/22 Yes Rucker, Magdalen Spatz, MD  albuterol (PROVENTIL) (2.5 MG/3ML)  0.083% nebulizer solution Take 2.5 mg by nebulization every 6 (six) hours as needed for wheezing or shortness of breath.    [provider]  albuterol (VENTOLIN HFA) 108 (90 Base) MCG/ACT inhaler Inhale into the lungs every 6 (six) hours as needed for wheezing or shortness of breath.    [provider]  meloxicam (MOBIC) 7.5 MG tablet TAKE 1 TABLET BY MOUTH DAILY AS NEEDED FOR PAIN 06/15/22   Suzan Slick, MD    ROS: All other systems have been reviewed and were otherwise negative with the exception of those mentioned in the HPI and as above.  Physical Exam: General: Alert, no acute distress Cardiovascular: No pedal edema Respiratory: No cyanosis, no use of accessory musculature GI: No organomegaly, abdomen is soft and non-tender Skin: No lesions in the area of chief complaint Neurologic: Sensation intact distally Psychiatric: Patient is competent for consent with normal mood and affect Lymphatic: No axillary or cervical lymphadenopathy  MUSCULOSKELETAL:  Range of motion of the shoulder is full.  He is grossly tender to palpation about the biceps as well as the bicipital groove.  Positive Speed.  Positive O'Briens.  Positive impingement.  Positive AC tenderness to palpation.    Imaging: MRI reviewed in [place] demonstrates an obvious SLAP tear.  Significant edema in the distal clavicle.  Type 2 acromion.  Subacromial bursitis and some debris in the joint.    Assessment: RIGHT SHOULDER CARTILAGE DISORDER,OA, IMPINGEMENT SYNDROME,BICEPS TENDIDITS.  Plan: Plan for Procedure(s): SHOULDER ARTHROSCOPY WITH SUBACROMIAL DECOMPRESSION AND BICEP TENDON REPAIR SHOULDER ARTHROSCOPY WITH DISTAL CLAVICLE EXCISION  The risks benefits and alternatives were discussed with the patient including but not limited to the risks of nonoperative treatment, versus surgical intervention including infection, bleeding, nerve injury,  blood clots, cardiopulmonary complications, morbidity,  mortality, among others, and they were willing to proceed.   The patient acknowledged the explanation, agreed to proceed with the plan and consent was signed.   Operative Plan: Right shoulder scope with biceps tenodesis, distal clavicle excision, and subacromial decompression  Discharge Medications: standard DVT Prophylaxis: none Physical Therapy: outpatient PT Special Discharge needs: Sling (should bring with him). [address] [place], [place]  11/02/2022 6:18 PM

## 2022-11-03 ENCOUNTER — Other Ambulatory Visit: Payer: Self-pay

## 2022-11-03 ENCOUNTER — Ambulatory Visit (HOSPITAL_BASED_OUTPATIENT_CLINIC_OR_DEPARTMENT_OTHER): Payer: Managed Care, Other (non HMO) | Admitting: Certified Registered"

## 2022-11-03 ENCOUNTER — Ambulatory Visit (HOSPITAL_BASED_OUTPATIENT_CLINIC_OR_DEPARTMENT_OTHER)
Admission: RE | Admit: 2022-11-03 | Discharge: 2022-11-03 | Disposition: A | Discharge status: Home / Self Care | Payer: Managed Care, Other (non HMO) | Attending: Orthopaedic Surgery | Admitting: Orthopaedic Surgery

## 2022-11-03 ENCOUNTER — Encounter (HOSPITAL_BASED_OUTPATIENT_CLINIC_OR_DEPARTMENT_OTHER)
Admission: RE | Disposition: A | Discharge status: Home / Self Care | Payer: Self-pay | Source: Home / Self Care | Attending: Orthopaedic Surgery

## 2022-11-03 ENCOUNTER — Encounter (HOSPITAL_BASED_OUTPATIENT_CLINIC_OR_DEPARTMENT_OTHER): Payer: Self-pay | Admitting: Orthopaedic Surgery

## 2022-11-03 DIAGNOSIS — J45909 Unspecified asthma, uncomplicated: Secondary | ICD-10-CM | POA: Diagnosis not present

## 2022-11-03 DIAGNOSIS — G4733 Obstructive sleep apnea (adult) (pediatric): Secondary | ICD-10-CM | POA: Insufficient documentation

## 2022-11-03 DIAGNOSIS — M7541 Impingement syndrome of right shoulder: Secondary | ICD-10-CM | POA: Insufficient documentation

## 2022-11-03 DIAGNOSIS — Z6837 Body mass index (BMI) 37.0-37.9, adult: Secondary | ICD-10-CM | POA: Diagnosis not present

## 2022-11-03 DIAGNOSIS — M7521 Bicipital tendinitis, right shoulder: Secondary | ICD-10-CM | POA: Insufficient documentation

## 2022-11-03 DIAGNOSIS — M19011 Primary osteoarthritis, right shoulder: Secondary | ICD-10-CM

## 2022-11-03 DIAGNOSIS — X58XXXA Exposure to other specified factors, initial encounter: Secondary | ICD-10-CM | POA: Insufficient documentation

## 2022-11-03 DIAGNOSIS — S43431A Superior glenoid labrum lesion of right shoulder, initial encounter: Secondary | ICD-10-CM | POA: Insufficient documentation

## 2022-11-03 DIAGNOSIS — G4730 Sleep apnea, unspecified: Secondary | ICD-10-CM

## 2022-11-03 DIAGNOSIS — S43431D Superior glenoid labrum lesion of right shoulder, subsequent encounter: Secondary | ICD-10-CM | POA: Diagnosis not present

## 2022-11-03 DIAGNOSIS — F419 Anxiety disorder, unspecified: Secondary | ICD-10-CM | POA: Insufficient documentation

## 2022-11-03 HISTORY — DX: Anxiety disorder, unspecified: F41.9

## 2022-11-03 HISTORY — PX: SHOULDER ARTHROSCOPY WITH SUBACROMIAL DECOMPRESSION AND BICEP TENDON REPAIR: SHX5689

## 2022-11-03 HISTORY — PX: SHOULDER ARTHROSCOPY WITH DISTAL CLAVICLE RESECTION: SHX5675

## 2022-11-03 SURGERY — SHOULDER ARTHROSCOPY WITH SUBACROMIAL DECOMPRESSION AND BICEP TENDON REPAIR
Anesthesia: General | Site: Shoulder | Laterality: Right

## 2022-11-03 MED ORDER — ONDANSETRON HCL 4 MG/2ML IJ SOLN
INTRAMUSCULAR | Status: AC
  Filled 2022-11-03: qty 2

## 2022-11-03 MED ORDER — PROPOFOL 10 MG/ML IV BOLUS
INTRAVENOUS | Status: AC
  Filled 2022-11-03: qty 20

## 2022-11-03 MED ORDER — EPHEDRINE 5 MG/ML INJ
INTRAVENOUS | Status: AC
  Filled 2022-11-03: qty 5

## 2022-11-03 MED ORDER — GABAPENTIN 300 MG PO CAPS
300.0000 mg | ORAL_CAPSULE | Freq: Once | ORAL | Status: AC
  Administered 2022-11-03: 300 mg via ORAL

## 2022-11-03 MED ORDER — FENTANYL CITRATE (PF) 100 MCG/2ML IJ SOLN
INTRAMUSCULAR | Status: DC | PRN
  Administered 2022-11-03: 100 ug via INTRAVENOUS

## 2022-11-03 MED ORDER — ONDANSETRON HCL 4 MG/2ML IJ SOLN
INTRAMUSCULAR | Status: DC | PRN
  Administered 2022-11-03: 4 mg via INTRAVENOUS

## 2022-11-03 MED ORDER — ROCURONIUM BROMIDE 10 MG/ML (PF) SYRINGE
PREFILLED_SYRINGE | INTRAVENOUS | Status: AC
  Filled 2022-11-03: qty 10

## 2022-11-03 MED ORDER — OXYCODONE HCL 5 MG PO TABS
ORAL_TABLET | ORAL | Status: AC
  Filled 2022-11-03: qty 1

## 2022-11-03 MED ORDER — ACETAMINOPHEN 500 MG PO TABS
1000.0000 mg | ORAL_TABLET | Freq: Once | ORAL | Status: AC
  Administered 2022-11-03: 1000 mg via ORAL

## 2022-11-03 MED ORDER — FENTANYL CITRATE (PF) 100 MCG/2ML IJ SOLN
INTRAMUSCULAR | Status: AC
  Filled 2022-11-03: qty 2

## 2022-11-03 MED ORDER — CEFAZOLIN IN SODIUM CHLORIDE 3-0.9 GM/100ML-% IV SOLN
INTRAVENOUS | Status: AC
  Filled 2022-11-03: qty 100

## 2022-11-03 MED ORDER — GABAPENTIN 300 MG PO CAPS
ORAL_CAPSULE | ORAL | Status: AC
  Filled 2022-11-03: qty 1

## 2022-11-03 MED ORDER — MIDAZOLAM HCL 2 MG/2ML IJ SOLN
INTRAMUSCULAR | Status: AC
  Filled 2022-11-03: qty 2

## 2022-11-03 MED ORDER — BUPIVACAINE-EPINEPHRINE (PF) 0.5% -1:200000 IJ SOLN
INTRAMUSCULAR | Status: DC | PRN
  Administered 2022-11-03: 15 mL via PERINEURAL

## 2022-11-03 MED ORDER — CEFAZOLIN SODIUM-DEXTROSE 2-4 GM/100ML-% IV SOLN
INTRAVENOUS | Status: AC
  Filled 2022-11-03: qty 100

## 2022-11-03 MED ORDER — LIDOCAINE 2% (20 MG/ML) 5 ML SYRINGE
INTRAMUSCULAR | Status: AC
  Filled 2022-11-03: qty 5

## 2022-11-03 MED ORDER — SUGAMMADEX SODIUM 200 MG/2ML IV SOLN
INTRAVENOUS | Status: DC | PRN
  Administered 2022-11-03: 400 mg via INTRAVENOUS

## 2022-11-03 MED ORDER — AMISULPRIDE (ANTIEMETIC) 5 MG/2ML IV SOLN: 10.0000 mg | Freq: Once | INTRAVENOUS | Status: DC | PRN

## 2022-11-03 MED ORDER — LIDOCAINE 2% (20 MG/ML) 5 ML SYRINGE
INTRAMUSCULAR | Status: DC | PRN
  Administered 2022-11-03: 100 mg via INTRAVENOUS

## 2022-11-03 MED ORDER — ACETAMINOPHEN 500 MG PO TABS: 1000.0000 mg | ORAL_TABLET | Freq: Three times a day (TID) | ORAL | 0 refills | Status: AC

## 2022-11-03 MED ORDER — OXYCODONE HCL 5 MG PO TABS: ORAL_TABLET | ORAL | 0 refills | Status: AC

## 2022-11-03 MED ORDER — LACTATED RINGERS IV SOLN: INTRAVENOUS | Status: DC

## 2022-11-03 MED ORDER — EPHEDRINE SULFATE (PRESSORS) 50 MG/ML IJ SOLN
INTRAMUSCULAR | Status: DC | PRN
  Administered 2022-11-03: 5 mg via INTRAVENOUS

## 2022-11-03 MED ORDER — SODIUM CHLORIDE 0.9 % IR SOLN: Status: DC | PRN

## 2022-11-03 MED ORDER — ONDANSETRON HCL 4 MG PO TABS: 4.0000 mg | ORAL_TABLET | Freq: Three times a day (TID) | ORAL | 0 refills | Status: AC | PRN

## 2022-11-03 MED ORDER — PROPOFOL 10 MG/ML IV BOLUS
INTRAVENOUS | Status: DC | PRN
  Administered 2022-11-03: 200 mg via INTRAVENOUS

## 2022-11-03 MED ORDER — PHENYLEPHRINE HCL (PRESSORS) 10 MG/ML IV SOLN
INTRAVENOUS | Status: AC
  Filled 2022-11-03: qty 1

## 2022-11-03 MED ORDER — MELOXICAM 7.5 MG PO TABS: 7.5000 mg | ORAL_TABLET | Freq: Two times a day (BID) | ORAL | 0 refills | Status: AC

## 2022-11-03 MED ORDER — ROCURONIUM BROMIDE 100 MG/10ML IV SOLN
INTRAVENOUS | Status: DC | PRN
  Administered 2022-11-03: 60 mg via INTRAVENOUS

## 2022-11-03 MED ORDER — BUPIVACAINE LIPOSOME 1.3 % IJ SUSP
INTRAMUSCULAR | Status: DC | PRN
  Administered 2022-11-03: 10 mL via PERINEURAL

## 2022-11-03 MED ORDER — TRANEXAMIC ACID-NACL 1000-0.7 MG/100ML-% IV SOLN
1000.0000 mg | INTRAVENOUS | Status: AC
  Administered 2022-11-03: 1000 mg via INTRAVENOUS

## 2022-11-03 MED ORDER — MIDAZOLAM HCL 2 MG/2ML IJ SOLN
2.0000 mg | Freq: Once | INTRAMUSCULAR | Status: AC
  Administered 2022-11-03: 2 mg via INTRAVENOUS

## 2022-11-03 MED ORDER — OXYCODONE HCL 5 MG PO TABS
5.0000 mg | ORAL_TABLET | Freq: Once | ORAL | Status: AC | PRN
  Administered 2022-11-03: 5 mg via ORAL

## 2022-11-03 MED ORDER — ACETAMINOPHEN 500 MG PO TABS
ORAL_TABLET | ORAL | Status: AC
  Filled 2022-11-03: qty 2

## 2022-11-03 MED ORDER — TRANEXAMIC ACID-NACL 1000-0.7 MG/100ML-% IV SOLN
INTRAVENOUS | Status: AC
  Filled 2022-11-03: qty 100

## 2022-11-03 MED ORDER — OXYCODONE HCL 5 MG/5ML PO SOLN: 5.0000 mg | Freq: Once | ORAL | Status: AC | PRN

## 2022-11-03 MED ORDER — FENTANYL CITRATE (PF) 100 MCG/2ML IJ SOLN
100.0000 ug | Freq: Once | INTRAMUSCULAR | Status: AC
  Administered 2022-11-03: 100 ug via INTRAVENOUS

## 2022-11-03 MED ORDER — FENTANYL CITRATE (PF) 100 MCG/2ML IJ SOLN
25.0000 ug | INTRAMUSCULAR | Status: DC | PRN
  Administered 2022-11-03 (×2): 50 ug via INTRAVENOUS

## 2022-11-03 MED ORDER — CEFAZOLIN IN SODIUM CHLORIDE 3-0.9 GM/100ML-% IV SOLN
3.0000 g | INTRAVENOUS | Status: AC
  Administered 2022-11-03: 3 g via INTRAVENOUS

## 2022-11-03 MED ORDER — DEXAMETHASONE SODIUM PHOSPHATE 10 MG/ML IJ SOLN
INTRAMUSCULAR | Status: DC | PRN
  Administered 2022-11-03: 10 mg via INTRAVENOUS

## 2022-11-03 MED ORDER — DEXAMETHASONE SODIUM PHOSPHATE 10 MG/ML IJ SOLN
INTRAMUSCULAR | Status: AC
  Filled 2022-11-03: qty 1

## 2022-11-03 SURGICAL SUPPLY — 53 items
AID PSTN UNV HD RSTRNT DISP (MISCELLANEOUS) ×1
ANCH SUT 2 FBRTK KNTLS 1.8 (Anchor) ×3 IMPLANT
ANCHOR SUT 1.8 FIBERTAK SB KL (Anchor) IMPLANT
APL PRP STRL LF DISP 70% ISPRP (MISCELLANEOUS) ×1
BLADE EXCALIBUR 4.0X13 (MISCELLANEOUS) ×1 IMPLANT
BURR OVAL 8 FLU 4.0X13 (MISCELLANEOUS) ×1 IMPLANT
CANNULA 5.75X71 LONG (CANNULA) IMPLANT
CANNULA PASSPORT 5 (CANNULA) IMPLANT
CANNULA PASSPORT BUTTON 10-40 (CANNULA) IMPLANT
CANNULA TWIST IN 8.25X7CM (CANNULA) IMPLANT
CHLORAPREP W/TINT 26 (MISCELLANEOUS) ×1 IMPLANT
CLSR STERI-STRIP ANTIMIC 1/2X4 (GAUZE/BANDAGES/DRESSINGS) ×1 IMPLANT
COOLER ICEMAN CLASSIC (MISCELLANEOUS) ×1 IMPLANT
DRAPE IMP U-DRAPE 54X76 (DRAPES) ×1 IMPLANT
DRAPE INCISE IOBAN 66X45 STRL (DRAPES) IMPLANT
DRAPE SHOULDER BEACH CHAIR (DRAPES) ×1 IMPLANT
DW OUTFLOW CASSETTE/TUBE SET (MISCELLANEOUS) ×1 IMPLANT
GAUZE PAD ABD 8X10 STRL (GAUZE/BANDAGES/DRESSINGS) ×1 IMPLANT
GAUZE SPONGE 4X4 12PLY STRL (GAUZE/BANDAGES/DRESSINGS) ×1 IMPLANT
GLOVE BIO SURGEON STRL SZ 6.5 (GLOVE) ×1 IMPLANT
GLOVE BIOGEL PI IND STRL 6.5 (GLOVE) ×1 IMPLANT
GLOVE BIOGEL PI IND STRL 8 (GLOVE) ×1 IMPLANT
GLOVE ECLIPSE 8.0 STRL XLNG CF (GLOVE) ×1 IMPLANT
GOWN STRL REUS W/ TWL LRG LVL3 (GOWN DISPOSABLE) ×2 IMPLANT
GOWN STRL REUS W/TWL LRG LVL3 (GOWN DISPOSABLE) ×2
GOWN STRL REUS W/TWL XL LVL3 (GOWN DISPOSABLE) ×1 IMPLANT
KIT STABILIZATION SHOULDER (MISCELLANEOUS) ×1 IMPLANT
KIT STR SPEAR 1.8 FBRTK DISP (KITS) IMPLANT
LASSO CRESCENT QUICKPASS (SUTURE) IMPLANT
MANIFOLD NEPTUNE II (INSTRUMENTS) ×1 IMPLANT
NDL HD SCORPION MEGA LOADER (NEEDLE) IMPLANT
NDL SAFETY ECLIP 18X1.5 (MISCELLANEOUS) ×1 IMPLANT
PACK ARTHROSCOPY DSU (CUSTOM PROCEDURE TRAY) ×1 IMPLANT
PACK BASIN DAY SURGERY FS (CUSTOM PROCEDURE TRAY) ×1 IMPLANT
PAD COLD SHLDR WRAP-ON (PAD) ×1 IMPLANT
PORT APPOLLO RF 90DEGREE MULTI (SURGICAL WAND) ×1 IMPLANT
RESTRAINT HEAD UNIVERSAL NS (MISCELLANEOUS) ×1 IMPLANT
SHEET MEDIUM DRAPE 40X70 STRL (DRAPES) ×1 IMPLANT
SLEEVE SCD COMPRESS KNEE MED (STOCKING) ×1 IMPLANT
SLING ARM FOAM STRAP LRG (SOFTGOODS) IMPLANT
SUT FIBERWIRE #2 38 T-5 BLUE (SUTURE)
SUT MNCRL AB 4-0 PS2 18 (SUTURE) ×1 IMPLANT
SUT PDS AB 1 CT  36 (SUTURE)
SUT PDS AB 1 CT 36 (SUTURE) IMPLANT
SUT TIGER TAPE 7 IN WHITE (SUTURE) IMPLANT
SUTURE FIBERWR #2 38 T-5 BLUE (SUTURE) IMPLANT
SUTURE TAPE TIGERLINK 1.3MM BL (SUTURE) IMPLANT
SUTURETAPE TIGERLINK 1.3MM BL (SUTURE)
SYR 5ML LL (SYRINGE) ×1 IMPLANT
TAPE FIBER 2MM 7IN #2 BLUE (SUTURE) IMPLANT
TOWEL GREEN STERILE FF (TOWEL DISPOSABLE) ×2 IMPLANT
TUBE CONNECTING 20X1/4 (TUBING) ×1 IMPLANT
TUBING ARTHROSCOPY IRRIG 16FT (MISCELLANEOUS) ×1 IMPLANT

## 2022-11-03 NOTE — Transfer of Care (Signed)
Immediate Anesthesia Transfer of Care Note  Patient: Darren Crawford  Procedure(s) Performed: SHOULDER ARTHROSCOPY WITH SUBACROMIAL DECOMPRESSION AND BICEP TENDON REPAIR (Right: Shoulder) SHOULDER ARTHROSCOPY WITH DISTAL CLAVICLE EXCISION (Right: Shoulder)  Patient Location: PACU  Anesthesia Type:GA combined with regional for post-op pain  Level of Consciousness: drowsy  Airway & Oxygen Therapy: Patient Spontanous Breathing and Patient connected to face mask oxygen  Post-op Assessment: Report given to RN and Post -op Vital signs reviewed and stable  Post vital signs: Reviewed and stable  Last Vitals:  Vitals Value Taken Time  BP 128/88 11/03/22 1116  Temp    Pulse 85 11/03/22 1118  Resp 29 11/03/22 1118  SpO2 98 % 11/03/22 1118  Vitals shown include unvalidated device data.  Last Pain:  Vitals:   11/03/22 0835  TempSrc: Oral  PainSc: 6       Patients Stated Pain Goal: 3 (11/03/22 0835)  Complications: No notable events documented.

## 2022-11-03 NOTE — Anesthesia Procedure Notes (Signed)
Procedure Name: Intubation Date/Time: 11/03/2022 10:09 AM  Performed by: Lauralyn Primes, CRNAPre-anesthesia Checklist: Patient identified, Emergency Drugs available, Suction available and Patient being monitored Patient Re-evaluated:Patient Re-evaluated prior to induction Oxygen Delivery Method: Circle system utilized Preoxygenation: Pre-oxygenation with 100% oxygen Induction Type: IV induction Ventilation: Mask ventilation without difficulty Laryngoscope Size: Mac and 4 Grade View: Grade I Tube type: Oral Tube size: 7.5 mm Number of attempts: 1 Airway Equipment and Method: Stylet Placement Confirmation: ETT inserted through vocal cords under direct vision, positive ETCO2 and breath sounds checked- equal and bilateral Secured at: 23 cm Tube secured with: Tape Dental Injury: Teeth and Oropharynx as per pre-operative assessment

## 2022-11-03 NOTE — Anesthesia Postprocedure Evaluation (Signed)
Anesthesia Post Note  Patient: Darren Crawford  Procedure(s) Performed: SHOULDER ARTHROSCOPY WITH SUBACROMIAL DECOMPRESSION AND BICEP TENDON REPAIR (Right: Shoulder) SHOULDER ARTHROSCOPY WITH DISTAL CLAVICLE EXCISION (Right: Shoulder)     Patient location during evaluation: PACU Anesthesia Type: General Level of consciousness: awake and alert Pain management: pain level controlled Vital Signs Assessment: post-procedure vital signs reviewed and stable Respiratory status: spontaneous breathing, nonlabored ventilation and respiratory function stable Cardiovascular status: blood pressure returned to baseline Postop Assessment: no apparent nausea or vomiting Anesthetic complications: no Comments: Patient with c/o heavy feeling in chest with deep breath early in PACU stay while supine. Repositioned to fowler's and symptoms greatly improved. Patient seen in Phase 2 and states that chest discomfort has resolved, continues to feel somewhat like it is difficult to take a deep breath but states this is mild. SpO2 mid-90s on RA. Discussed that phrenic nerve paralysis is an expected side effect of his interscalene nerve block and this would resolve in the next day or so. All questions answered. Stephannie Peters, MD   No notable events documented.  Last Vitals:  Vitals:   11/03/22 1215 11/03/22 1230  BP: 99/86 129/79  Pulse: 72 62  Resp: 13 13  Temp:    SpO2: 94% 94%    Last Pain:  Vitals:   11/03/22 1256  TempSrc:   PainSc: 4                  Shanda Howells

## 2022-11-03 NOTE — Interval H&P Note (Signed)
All questions answered, patient wants to proceed with procedure. ? ?

## 2022-11-03 NOTE — Anesthesia Procedure Notes (Signed)
Anesthesia Regional Block: Interscalene brachial plexus block   Pre-Anesthetic Checklist: , timeout performed,  Correct Patient, Correct Site, Correct Laterality,  Correct Procedure, Correct Position, site marked,  Risks and benefits discussed,  Pre-op evaluation,  At surgeon's request and post-op pain management  Laterality: Right  Prep: Maximum Sterile Barrier Precautions used, chloraprep       Needles:  Injection technique: Single-shot  Needle Type: Echogenic Stimulator Needle     Needle Length: 4cm  Needle Gauge: 22     Additional Needles:   Procedures:,,,, ultrasound used (permanent image in chart),,    Narrative:  Start time: 11/03/2022 9:15 AM End time: 11/03/2022 9:18 AM Injection made incrementally with aspirations every 5 mL.  Performed by: Personally  Anesthesiologist: Kaylyn Layer, MD  Additional Notes: Risks, benefits, and alternative discussed. Patient gave consent for procedure. Patient prepped and draped in sterile fashion. Sedation administered, patient remains easily responsive to voice. Relevant anatomy identified with ultrasound guidance. Local anesthetic given in 5cc increments with no signs or symptoms of intravascular injection. No pain or paraesthesias with injection. Patient monitored throughout procedure with signs of LAST or immediate complications. Tolerated well. Ultrasound image placed in chart.  Amalia Greenhouse, MD

## 2022-11-03 NOTE — Op Note (Signed)
Orthopaedic Surgery Operative Note (CSN: [id number])  Darren Crawford  [birth year] Date of Surgery: 11/03/2022   DIAGNOSES: Right shoulder, SLAP tear, biceps tendinitis, AC arthritis, and subacromial impingement.  POST-OPERATIVE DIAGNOSIS: same  PROCEDURE: Arthroscopic extensive debridement - 29823 Subdeltoid Bursa, Supraspinatus Tendon, Anterior Labrum, and Superior Labrum Arthroscopic distal clavicle excision - 40981 Arthroscopic subacromial decompression - 19147 Arthroscopic biceps tenodesis - 82956   OPERATIVE FINDING: Exam under anesthesia: Normal Articular space:  anterior, superior and posterior labral tearing debrided Chondral surfaces: Normal Biceps:  Type 2 slap tear and tendintis of the tendon Subscapularis: Intact  Supraspinatus: Intact  Infraspinatus: Intact      Post-operative plan: The patient will be non-weightbearing in a sling 4 weeks.  The patient will be discharged home.  DVT prophylaxis not indicated in ambulatory upper extremity patient without known risk factors.   Pain control with PRN pain medication preferring oral medicines.  Follow up plan will be scheduled in approximately 7 days for incision check and XR.  Surgeons:Primary: Bjorn Pippin, MD Assistants:Caroline McBane PA-C Location: [facility] OR ROOM 6 Anesthesia: General with Exparel interscalene block Antibiotics: Ancef 3 g Tourniquet time: None Estimated Blood Loss: Minimal Complications: None Specimens: None Implants: Implant Name Type Inv. Item Serial No. Manufacturer Lot No. LRB No. Used Action  ANCHOR SUT 1.8 FIBERTAK SB KL - [id number] Anchor ANCHOR SUT 1.8 FIBERTAK SB KL  [employer] [id number] Right 1 Implanted  ANCHOR SUT 1.8 FIBERTAK SB KL - [id number] Anchor ANCHOR SUT 1.8 [employer]IBERTAK SB KL  [employer] [id number] Right 1 Implanted  ANCHOR SUT 1.8 [employer] SB KL - [id number] Anchor ANCHOR SUT 1.8 [employer] [id number] Right 1 Implanted    Indications for Surgery:   Darren Crawford  is a 37 y.o. male with continued shoulder pain refractory to nonoperative measures for extended period of time.    The risks and benefits were explained at length including but not limited to continued pain, cuff failure, biceps tenodesis failure, stiffness, need for further surgery and infection.   Procedure:   Patient was correctly identified in the preoperative holding area and operative site marked.  Patient brought to OR and positioned beachchair on an [place] table ensuring that all bony prominences were padded and the head was in an appropriate location.  Anesthesia was induced and the operative shoulder was prepped and draped in the usual sterile fashion.  Timeout was called preincision.  A standard posterior viewing portal was made after localizing the portal with a spinal needle.  An anterior accessory portal was also made.  After clearing the articular space the camera was positioned in the subacromial space.  Findings above.    Extensive debridement was performed of the anterior interval tissue, labral fraying and the bursa.  Subacromial decompression: We made a lateral portal with spinal needle guidance. We then proceeded to debride bursal tissue extensively with a shaver and arthrocare device. At that point we continued to identify the borders of the acromion and identify the spur. We then carefully preserved the deltoid fascia and used a burr to convert the acromion to a Type 1 flat acromion without issue.  Biceps tenodesis: We marked the tendon and then performed a tenotomy and debridement of the stump in the articular space. We then identified the biceps tendon in its groove suprapec with the arthroscope in the lateral portal taking care to move from lateral to medial to avoid injury to the subscapularis. At that point we unroofed the tendon itself and mobilized  it. An accessory anterior portal was made in line with the tendon and we grasped it from the anterior superior portal and worked  from the accessory anterior portal. Two [employer] 1.87mm knotless anchors were placed in the groove and the tendon was secured in a luggage loop style fashion with a pass of the limb of suture through the tendon using a scorpion device to avoid pull-through.  Repair was completed with good tension on the tendon.  Residual stump of the tendon was removed after being resected with a RF ablator.  Distal Clavicle resection:  The scope was placed in the subacromial space from the posterior portal.  A hemostat was placed through the anterior portal and we spread at the [facility] joint.  A burr was then inserted and 10 mm of distal clavicle was resected taking care to avoid damage to the capsule around the joint and avoiding overhanging bone posteriorly.     The incisions were closed with absorbable monocryl and steri strips.  A sterile dressing was placed along with a sling. The patient was awoken from general anesthesia and taken to the PACU in stable condition without complication.   Alfonse Alpers, PA-C, present and scrubbed throughout the case, critical for completion in a timely fashion, and for retraction, instrumentation, closure.

## 2022-11-03 NOTE — Progress Notes (Signed)
Assisted Dr. Howze with right, interscalene , ultrasound guided block. Side rails up, monitors on throughout procedure. See vital signs in flow sheet. Tolerated Procedure well. 

## 2022-11-03 NOTE — Anesthesia Preprocedure Evaluation (Addendum)
Anesthesia Evaluation  Patient identified by MRN, date of birth, ID band Patient awake    Reviewed: Allergy & Precautions, NPO status , Patient's Chart, lab work & pertinent test results  History of Anesthesia Complications Negative for: history of anesthetic complications  Airway Mallampati: II  TM Distance: >3 FB Neck ROM: Full    Dental no notable dental hx.    Pulmonary asthma , sleep apnea    Pulmonary exam normal        Cardiovascular negative cardio ROS Normal cardiovascular exam     Neuro/Psych   Anxiety        GI/Hepatic negative GI ROS, Neg liver ROS,,,  Endo/Other  BMI 36  Renal/GU negative Renal ROS     Musculoskeletal RIGHT SHOULDER CARTILAGE DISORDER,OA, IMPINGEMENT SYNDROME,BICEPS TENDIDITS   Abdominal   Peds  Hematology negative hematology ROS (+)   Anesthesia Other Findings Day of surgery medications reviewed with patient.  Reproductive/Obstetrics                              Anesthesia Physical Anesthesia Plan  ASA: 2  Anesthesia Plan: General   Post-op Pain Management: Regional block* and Tylenol PO (pre-op)*   Induction: Intravenous  PONV Risk Score and Plan: 2 and Ondansetron, Dexamethasone, Treatment may vary due to age or medical condition and Midazolam  Airway Management Planned: Oral ETT  Additional Equipment: None  Intra-op Plan:   Post-operative Plan: Extubation in OR  Informed Consent: I have reviewed the patients History and Physical, chart, labs and discussed the procedure including the risks, benefits and alternatives for the proposed anesthesia with the patient or authorized representative who has indicated his/her understanding and acceptance.     Dental advisory given  Plan Discussed with: CRNA  Anesthesia Plan Comments:         Anesthesia Quick Evaluation

## 2022-11-04 ENCOUNTER — Encounter (HOSPITAL_BASED_OUTPATIENT_CLINIC_OR_DEPARTMENT_OTHER): Payer: Self-pay | Admitting: Orthopaedic Surgery

## 2022-11-08 ENCOUNTER — Other Ambulatory Visit: Payer: Self-pay

## 2022-11-08 ENCOUNTER — Encounter: Payer: Self-pay | Admitting: Physical Therapy

## 2022-11-08 ENCOUNTER — Ambulatory Visit: Payer: Managed Care, Other (non HMO) | Attending: Physician Assistant | Admitting: Physical Therapy

## 2022-11-08 DIAGNOSIS — M6281 Muscle weakness (generalized): Secondary | ICD-10-CM | POA: Diagnosis present

## 2022-11-08 DIAGNOSIS — R29898 Other symptoms and signs involving the musculoskeletal system: Secondary | ICD-10-CM

## 2022-11-08 DIAGNOSIS — M25511 Pain in right shoulder: Secondary | ICD-10-CM | POA: Diagnosis present

## 2022-11-08 NOTE — Therapy (Signed)
OUTPATIENT PHYSICAL THERAPY SHOULDER EVALUATION   Patient Name: Darren Crawford MRN: [id number] DOB:January 10, 1986, 37 y.o., male Today's Date: 11/08/2022  END OF SESSION:  PT End of Session - 11/08/22 1315     Visit Number 1    Number of Visits 16    Date for PT Re-Evaluation 01/03/23    Authorization Type [employer]    PT Start Time 1100    PT Stop Time 1145    PT Time Calculation (min) 45 min    Activity Tolerance Patient tolerated treatment well    Behavior During Therapy [facility] for tasks assessed/performed             Past Medical History:  Diagnosis Date   Anxiety    Asthma    Chronic allergic rhinitis 08/03/2016   Chronic right shoulder pain    Morbid obesity (HCC) 08/03/2016   Obstructive sleep apnea syndrome 08/03/2016   Past Surgical History:  Procedure Laterality Date   HERNIA REPAIR     SHOULDER ARTHROSCOPY WITH DISTAL CLAVICLE RESECTION Right 11/03/2022   Procedure: SHOULDER ARTHROSCOPY WITH DISTAL CLAVICLE EXCISION;  Surgeon: Bjorn Pippin, MD;  Location: [facility];  Service: Orthopedics;  Laterality: Right;   SHOULDER ARTHROSCOPY WITH SUBACROMIAL DECOMPRESSION AND BICEP TENDON REPAIR Right 11/03/2022   Procedure: SHOULDER ARTHROSCOPY WITH SUBACROMIAL DECOMPRESSION AND BICEP TENDON REPAIR;  Surgeon: Bjorn Pippin, MD;  Location: [facility];  Service: Orthopedics;  Laterality: Right;   TOE SURGERY     Patient Active Problem List   Diagnosis Date Noted   Right knee injury 06/16/2022   Labral tear of shoulder, right, initial encounter 12/13/2017   Chronic allergic rhinitis 08/03/2016   Morbid obesity (HCC) 08/03/2016   Obstructive sleep apnea syndrome 08/03/2016    PCP: Wyline Mood  REFERRING PROVIDER: Alfonse Alpers  REFERRING DIAG: Rt subacromial decompression, distal clavicle resection, biceps tenodesis repair  THERAPY DIAG:  Muscle weakness (generalized)  Acute pain of right shoulder  Other symptoms and signs involving the  musculoskeletal system  Rationale for Evaluation and Treatment: Rehabilitation  ONSET DATE: 11/03/22  SUBJECTIVE:                                                                                                                                                                                      SUBJECTIVE STATEMENT: Pt is s/p Rt subacromial decompression, distal clavicle resection, biceps tenodesis repair on 11/03/22. He states some pain at night, he has been icing at home. Hand dominance: Right  PERTINENT HISTORY: Rt shoulder labral tear  PAIN:  Are you having pain?  0/10 currently, 5/10 at worst  PRECAUTIONS: Other: shoulder - see protocol  WEIGHT  BEARING RESTRICTIONS: No  FALLS:  Has patient fallen in last 6 months? No   OCCUPATION: Interior and spatial designer  PLOF: Independent  PATIENT GOALS:return to normal function  NEXT MD VISIT:   OBJECTIVE:   PATIENT SURVEYS:  FOTO 29   POSTURE: In sling  UPPER EXTREMITY ROM:   Passive ROM Right eval Left eval  Shoulder flexion 53   Shoulder extension    Shoulder abduction 60   Shoulder adduction    Shoulder internal rotation [facility]   Shoulder external rotation 0   Elbow flexion WFL   Elbow extension -15   Wrist flexion    Wrist extension    Wrist ulnar deviation    Wrist radial deviation    Wrist pronation    Wrist supination    (Blank rows = not tested)  UPPER EXTREMITY MMT: Not assessed on eval due to precautions MMT Right eval Left eval  Shoulder flexion    Shoulder extension    Shoulder abduction    Shoulder adduction    Shoulder internal rotation    Shoulder external rotation    Middle trapezius    Lower trapezius    Elbow flexion    Elbow extension    Wrist flexion    Wrist extension    Wrist ulnar deviation    Wrist radial deviation    Wrist pronation    Wrist supination    Grip strength (lbs)    (Blank rows = not tested)   PALPATION:  Mild edema throughout Rt shoulder   TODAY'S  TREATMENT:                                                                                                                                         DATE: 11/08/22 See HEP   PATIENT EDUCATION: Education details: PT POC and goals, HEP Person educated: Patient Education method: Explanation, Demonstration, and Handouts Education comprehension: verbalized understanding and returned demonstration  HOME EXERCISE PROGRAM: Access Code: [email or web address] URL: [email or web address] Date: 11/08/2022 Prepared by: Reggy Eye  Exercises - Circular Shoulder Pendulum with Table Support  - 1 x daily - 7 x weekly - 3 sets - 10 reps - Flexion-Extension Shoulder Pendulum with Table Support  - 1 x daily - 7 x weekly - 3 sets - 10 reps - Horizontal Shoulder Pendulum with Table Support  - 1 x daily - 7 x weekly - 3 sets - 10 reps - Elbow Extension PROM  - 1 x daily - 7 x weekly - 3 sets - 10 reps - Seated Scapular Retraction  - 1 x daily - 7 x weekly - 3 sets - 10 reps  ASSESSMENT:  CLINICAL IMPRESSION: Patient is a 37 y.o. male who was seen today for physical therapy evaluation and treatment for Rt shoulder arthroscopy with subacromial decompression, distal clavicle resection and biceps tenodesis. Pt presents with decreased strength and ROM, increased pain, impaired posture and  decreased activity tolerance. Pt will benefit from skilled PT to address deficits and improve functional mobility and independence.   OBJECTIVE IMPAIRMENTS: decreased activity tolerance, decreased ROM, decreased strength, increased edema, increased muscle spasms, impaired UE functional use, and postural dysfunction.   ACTIVITY LIMITATIONS: carrying, lifting, sleeping, dressing, and reach over head  PARTICIPATION LIMITATIONS: meal prep, cleaning, driving, shopping, community activity, and yard work  PERSONAL FACTORS: Time since onset of injury/illness/exacerbation are also affecting patient's functional outcome.   REHAB  POTENTIAL: Good  CLINICAL DECISION MAKING: Stable/uncomplicated  EVALUATION COMPLEXITY: Low   GOALS: Goals reviewed with patient? Yes  SHORT TERM GOALS: Target date: 11/22/2022    Pt will be independent with initial HEP Baseline: Goal status: INITIAL   LONG TERM GOALS: Target date: 01/03/2023    Pt will be independent with advanced HEP Baseline:  Goal status: INITIAL  2.  Pt will improve FOTO to >= 70 to demo improved functional mobility Baseline:  Goal status: INITIAL  3.  Pt will improve Rt shoulder flexion and abduction ROM to 160 degrees to improve functional activity tolerance Baseline:  Goal status: INITIAL  4.  Pt will improve Rt UE strength to 4+/5 to progress to PLOF Baseline:  Goal status: INITIAL  5.  Pt will perform ADLs and IADLS with pain <= 2/10 Baseline:  Goal status: INITIAL   PLAN:  PT FREQUENCY: 2x/week  PT DURATION: 8 weeks  PLANNED INTERVENTIONS: Therapeutic exercises, Therapeutic activity, Neuromuscular re-education, Balance training, Gait training, Patient/Family education, Self Care, Joint mobilization, Aquatic Therapy, Dry Needling, Cryotherapy, Moist heat, Taping, Vasopneumatic device, Ultrasound, Ionotophoresis 4mg /ml Dexamethasone, Manual therapy, and Re-evaluation  PLAN FOR NEXT SESSION: PROM, AAROM as tolerated per protocol   Goldie Tregoning, PT 11/08/2022, 1:16 PM

## 2022-11-11 ENCOUNTER — Ambulatory Visit: Payer: Managed Care, Other (non HMO) | Admitting: Physical Therapy

## 2022-11-11 ENCOUNTER — Encounter: Payer: Self-pay | Admitting: Physical Therapy

## 2022-11-11 DIAGNOSIS — R29898 Other symptoms and signs involving the musculoskeletal system: Secondary | ICD-10-CM

## 2022-11-11 DIAGNOSIS — M6281 Muscle weakness (generalized): Secondary | ICD-10-CM

## 2022-11-11 DIAGNOSIS — M25511 Pain in right shoulder: Secondary | ICD-10-CM

## 2022-11-11 NOTE — Therapy (Signed)
OUTPATIENT PHYSICAL THERAPY SHOULDER EVALUATION   Patient Name: Darren Crawford MRN: [id number] DOB:09-25-1985, 37 y.o., male Today's Date: 11/11/2022  END OF SESSION:  PT End of Session - 11/11/22 0831     Visit Number 2    Number of Visits 16    Date for PT Re-Evaluation 01/03/23    PT Start Time 0800    PT Stop Time 0838    PT Time Calculation (min) 38 min    Activity Tolerance Patient tolerated treatment well    Behavior During Therapy [facility] for tasks assessed/performed              Past Medical History:  Diagnosis Date   Anxiety    Asthma    Chronic allergic rhinitis 08/03/2016   Chronic right shoulder pain    Morbid obesity (HCC) 08/03/2016   Obstructive sleep apnea syndrome 08/03/2016   Past Surgical History:  Procedure Laterality Date   HERNIA REPAIR     SHOULDER ARTHROSCOPY WITH DISTAL CLAVICLE RESECTION Right 11/03/2022   Procedure: SHOULDER ARTHROSCOPY WITH DISTAL CLAVICLE EXCISION;  Surgeon: Bjorn Pippin, MD;  Location: [facility];  Service: Orthopedics;  Laterality: Right;   SHOULDER ARTHROSCOPY WITH SUBACROMIAL DECOMPRESSION AND BICEP TENDON REPAIR Right 11/03/2022   Procedure: SHOULDER ARTHROSCOPY WITH SUBACROMIAL DECOMPRESSION AND BICEP TENDON REPAIR;  Surgeon: Bjorn Pippin, MD;  Location: [facility];  Service: Orthopedics;  Laterality: Right;   TOE SURGERY     Patient Active Problem List   Diagnosis Date Noted   Right knee injury 06/16/2022   Labral tear of shoulder, right, initial encounter 12/13/2017   Chronic allergic rhinitis 08/03/2016   Morbid obesity (HCC) 08/03/2016   Obstructive sleep apnea syndrome 08/03/2016    PCP: Wyline Mood  REFERRING PROVIDER: Alfonse Alpers  REFERRING DIAG: Rt subacromial decompression, distal clavicle resection, biceps tenodesis repair  THERAPY DIAG:  Muscle weakness (generalized)  Acute pain of right shoulder  Other symptoms and signs involving the musculoskeletal  system  Rationale for Evaluation and Treatment: Rehabilitation  ONSET DATE: 11/03/22  SUBJECTIVE:                                                                                                                                                                                      SUBJECTIVE STATEMENT: Pt reports no change since eval. He has been performing HEP   PERTINENT HISTORY: Pt is s/p Rt subacromial decompression, distal clavicle resection, biceps tenodesis repair on 11/03/22. He states some pain at night, he has been icing at home.  Rt shoulder labral tear  PAIN:  Are you having pain?  0/10 currently, 5/10 at worst  PRECAUTIONS: Other: shoulder -  see protocol  WEIGHT BEARING RESTRICTIONS: No  FALLS:  Has patient fallen in last 6 months? No   OCCUPATION: Interior and spatial designer  PLOF: Independent  PATIENT GOALS:return to normal function  NEXT MD VISIT:   OBJECTIVE:    UPPER EXTREMITY ROM:   Passive ROM Right eval Left eval  Shoulder flexion 53   Shoulder extension    Shoulder abduction 60   Shoulder adduction    Shoulder internal rotation [facility]   Shoulder external rotation 0   Elbow flexion WFL   Elbow extension -15   Wrist flexion    Wrist extension    Wrist ulnar deviation    Wrist radial deviation    Wrist pronation    Wrist supination    (Blank rows = not tested)  UPPER EXTREMITY MMT: Not assessed on eval due to precautions MMT Right eval Left eval  Shoulder flexion    Shoulder extension    Shoulder abduction    Shoulder adduction    Shoulder internal rotation    Shoulder external rotation    Middle trapezius    Lower trapezius    Elbow flexion    Elbow extension    Wrist flexion    Wrist extension    Wrist ulnar deviation    Wrist radial deviation    Wrist pronation    Wrist supination    Grip strength (lbs)    (Blank rows = not tested)   PALPATION:  Mild edema throughout Rt shoulder   TODAY'S TREATMENT:                                                                                                                                          [facility] Adult PT Treatment:                                                DATE: 11/11/22 Therapeutic Exercise: Pendulum A/P, laterally, CW/CCW all x 10 (before and after manual work) Scap squeeze x 12 Elbow AAROM flex/ext with dowel x 15 Shoulder abd AAROM with dowel x 15 Shoulder ER AAROM dowel x 15 Manual Therapy: PROM Rt shoulder as tolerated per protocol   DATE: 11/08/22 See HEP   PATIENT EDUCATION: Education details: PT POC and goals, HEP Person educated: Patient Education method: Explanation, Demonstration, and Handouts Education comprehension: verbalized understanding and returned demonstration  HOME EXERCISE PROGRAM: Access Code: [email or web address] URL: [email or web address] Date: 11/11/2022 Prepared by: Reggy Eye  Exercises - Circular Shoulder Pendulum with Table Support  - 1 x daily - 7 x weekly - 3 sets - 10 reps - Flexion-Extension Shoulder Pendulum with Table Support  - 1 x daily - 7 x weekly - 3 sets - 10 reps - Horizontal Shoulder Pendulum with Table Support  - 1  x daily - 7 x weekly - 3 sets - 10 reps - Elbow Extension PROM  - 1 x daily - 7 x weekly - 3 sets - 10 reps - Seated Scapular Retraction  - 1 x daily - 7 x weekly - 3 sets - 10 reps - Standing Shoulder External Rotation AAROM with Dowel  - 1 x daily - 7 x weekly - 3 sets - 10 reps - Standing Shoulder Abduction AAROM with Dowel  - 1 x daily - 7 x weekly - 3 sets - 10 reps  ASSESSMENT:  CLINICAL IMPRESSION: Pt with most resistance to PROM shoulder flexion. Improving tolerance to abd and ER after manual work  OBJECTIVE IMPAIRMENTS: decreased activity tolerance, decreased ROM, decreased strength, increased edema, increased muscle spasms, impaired UE functional use, and postural dysfunction.     GOALS: Goals reviewed with patient? Yes  SHORT TERM GOALS: Target date:  11/22/2022    Pt will be independent with initial HEP Baseline: Goal status: INITIAL   LONG TERM GOALS: Target date: 01/03/2023    Pt will be independent with advanced HEP Baseline:  Goal status: INITIAL  2.  Pt will improve FOTO to >= 70 to demo improved functional mobility Baseline:  Goal status: INITIAL  3.  Pt will improve Rt shoulder flexion and abduction ROM to 160 degrees to improve functional activity tolerance Baseline:  Goal status: INITIAL  4.  Pt will improve Rt UE strength to 4+/5 to progress to PLOF Baseline:  Goal status: INITIAL  5.  Pt will perform ADLs and IADLS with pain <= 2/10 Baseline:  Goal status: INITIAL   PLAN:  PT FREQUENCY: 2x/week  PT DURATION: 8 weeks  PLANNED INTERVENTIONS: Therapeutic exercises, Therapeutic activity, Neuromuscular re-education, Balance training, Gait training, Patient/Family education, Self Care, Joint mobilization, Aquatic Therapy, Dry Needling, Cryotherapy, Moist heat, Taping, Vasopneumatic device, Ultrasound, Ionotophoresis 4mg /ml Dexamethasone, Manual therapy, and Re-evaluation  PLAN FOR NEXT SESSION: PROM, AAROM as tolerated per protocol   Cambell Stanek, PT 11/11/2022, 8:35 AM

## 2022-11-14 ENCOUNTER — Ambulatory Visit: Payer: Managed Care, Other (non HMO) | Attending: Orthopaedic Surgery | Admitting: Physical Therapy

## 2022-11-14 ENCOUNTER — Encounter: Payer: Self-pay | Admitting: Physical Therapy

## 2022-11-14 DIAGNOSIS — M25511 Pain in right shoulder: Secondary | ICD-10-CM | POA: Insufficient documentation

## 2022-11-14 DIAGNOSIS — R29898 Other symptoms and signs involving the musculoskeletal system: Secondary | ICD-10-CM | POA: Diagnosis present

## 2022-11-14 DIAGNOSIS — M6281 Muscle weakness (generalized): Secondary | ICD-10-CM | POA: Insufficient documentation

## 2022-11-14 NOTE — Therapy (Signed)
OUTPATIENT PHYSICAL THERAPY SHOULDER    Patient Name: Darren Crawford MRN: [id number] DOB:[birth year], 37 y.o., male Today's Date: 11/14/2022  END OF SESSION:  PT End of Session - 11/14/22 1059     Visit Number 3    Number of Visits 16    Date for PT Re-Evaluation 01/03/23    PT Start Time 1026   pt arrived late   PT Stop Time 1100    PT Time Calculation (min) 34 min    Activity Tolerance Patient tolerated treatment well    Behavior During Therapy [facility] for tasks assessed/performed               Past Medical History:  Diagnosis Date   Anxiety    Asthma    Chronic allergic rhinitis 08/03/2016   Chronic right shoulder pain    Morbid obesity (HCC) 08/03/2016   Obstructive sleep apnea syndrome 08/03/2016   Past Surgical History:  Procedure Laterality Date   HERNIA REPAIR     SHOULDER ARTHROSCOPY WITH DISTAL CLAVICLE RESECTION Right 11/03/2022   Procedure: SHOULDER ARTHROSCOPY WITH DISTAL CLAVICLE EXCISION;  Surgeon: Bjorn Pippin, MD;  Location: [facility];  Service: Orthopedics;  Laterality: Right;   SHOULDER ARTHROSCOPY WITH SUBACROMIAL DECOMPRESSION AND BICEP TENDON REPAIR Right 11/03/2022   Procedure: SHOULDER ARTHROSCOPY WITH SUBACROMIAL DECOMPRESSION AND BICEP TENDON REPAIR;  Surgeon: Bjorn Pippin, MD;  Location: [facility];  Service: Orthopedics;  Laterality: Right;   TOE SURGERY     Patient Active Problem List   Diagnosis Date Noted   Right knee injury 06/16/2022   Labral tear of shoulder, right, initial encounter 12/13/2017   Chronic allergic rhinitis 08/03/2016   Morbid obesity (HCC) 08/03/2016   Obstructive sleep apnea syndrome 08/03/2016    PCP: Wyline Mood  REFERRING PROVIDER: Alfonse Alpers  REFERRING DIAG: Rt subacromial decompression, distal clavicle resection, biceps tenodesis repair  THERAPY DIAG:  Muscle weakness (generalized)  Acute pain of right shoulder  Other symptoms and signs involving the musculoskeletal  system  Rationale for Evaluation and Treatment: Rehabilitation  ONSET DATE: 11/03/22  SUBJECTIVE:                                                                                                                                                                                      SUBJECTIVE STATEMENT: Pt reports he goes to MD this afternoon for follow up   PERTINENT HISTORY: Pt is s/p Rt subacromial decompression, distal clavicle resection, biceps tenodesis repair on 11/03/22. He states some pain at night, he has been icing at home.  Rt shoulder labral tear  PAIN:  Are you having pain?  0/10 currently, 5/10 at  worst  PRECAUTIONS: Other: shoulder - see protocol  WEIGHT BEARING RESTRICTIONS: No  FALLS:  Has patient fallen in last 6 months? No   OCCUPATION: Interior and spatial designer  PLOF: Independent  PATIENT GOALS:return to normal function  NEXT MD VISIT:   OBJECTIVE:    UPPER EXTREMITY ROM:   Passive ROM Right eval Left eval  Shoulder flexion 53   Shoulder extension    Shoulder abduction 60   Shoulder adduction    Shoulder internal rotation [facility]   Shoulder external rotation 0   Elbow flexion WFL   Elbow extension -15   Wrist flexion    Wrist extension    Wrist ulnar deviation    Wrist radial deviation    Wrist pronation    Wrist supination    (Blank rows = not tested)  UPPER EXTREMITY MMT: Not assessed on eval due to precautions MMT Right eval Left eval  Shoulder flexion    Shoulder extension    Shoulder abduction    Shoulder adduction    Shoulder internal rotation    Shoulder external rotation    Middle trapezius    Lower trapezius    Elbow flexion    Elbow extension    Wrist flexion    Wrist extension    Wrist ulnar deviation    Wrist radial deviation    Wrist pronation    Wrist supination    Grip strength (lbs)    (Blank rows = not tested)   PALPATION:  Mild edema throughout Rt shoulder   TODAY'S TREATMENT:                                                                                                                                          [facility] Adult PT Treatment:                                                DATE: 11/14/22 Therapeutic Exercise: Pendulum A/P, laterally, CW/CCW all x 10 Elbow AAROM flex/ext with dowel x 15 Shoulder abd AAROM with dowel x 15 Small range shoulder AAROM with dowel x 15 Shoulder ER AAROM dowel x 15 Manual Therapy: PROM Rt shoulder as tolerated per protocol   [facility] Adult PT Treatment:                                                DATE: 11/11/22 Therapeutic Exercise: Pendulum A/P, laterally, CW/CCW all x 10 (before and after manual work) Scap squeeze x 12 Elbow AAROM flex/ext with dowel x 15 Shoulder abd AAROM with dowel x 15 Shoulder ER AAROM dowel x 15 Manual Therapy: PROM Rt shoulder as  tolerated per protocol    PATIENT EDUCATION: Education details: PT POC and goals, HEP Person educated: Patient Education method: Explanation, Demonstration, and Handouts Education comprehension: verbalized understanding and returned demonstration  HOME EXERCISE PROGRAM: Access Code: [email or web address] URL: [email or web address] Date: 11/11/2022 Prepared by: Reggy Eye  Exercises - Circular Shoulder Pendulum with Table Support  - 1 x daily - 7 x weekly - 3 sets - 10 reps - Flexion-Extension Shoulder Pendulum with Table Support  - 1 x daily - 7 x weekly - 3 sets - 10 reps - Horizontal Shoulder Pendulum with Table Support  - 1 x daily - 7 x weekly - 3 sets - 10 reps - Elbow Extension PROM  - 1 x daily - 7 x weekly - 3 sets - 10 reps - Seated Scapular Retraction  - 1 x daily - 7 x weekly - 3 sets - 10 reps - Standing Shoulder External Rotation AAROM with Dowel  - 1 x daily - 7 x weekly - 3 sets - 10 reps - Standing Shoulder Abduction AAROM with Dowel  - 1 x daily - 7 x weekly - 3 sets - 10 reps  ASSESSMENT:  CLINICAL IMPRESSION: Pt improving tolerance to passive and AAROM shoulder  flexion today. Still with significant limitation in ER, progressing well  OBJECTIVE IMPAIRMENTS: decreased activity tolerance, decreased ROM, decreased strength, increased edema, increased muscle spasms, impaired UE functional use, and postural dysfunction.     GOALS: Goals reviewed with patient? Yes  SHORT TERM GOALS: Target date: 11/22/2022    Pt will be independent with initial HEP Baseline: Goal status: INITIAL   LONG TERM GOALS: Target date: 01/03/2023    Pt will be independent with advanced HEP Baseline:  Goal status: INITIAL  2.  Pt will improve FOTO to >= 70 to demo improved functional mobility Baseline:  Goal status: INITIAL  3.  Pt will improve Rt shoulder flexion and abduction ROM to 160 degrees to improve functional activity tolerance Baseline:  Goal status: INITIAL  4.  Pt will improve Rt UE strength to 4+/5 to progress to PLOF Baseline:  Goal status: INITIAL  5.  Pt will perform ADLs and IADLS with pain <= 2/10 Baseline:  Goal status: INITIAL   PLAN:  PT FREQUENCY: 2x/week  PT DURATION: 8 weeks  PLANNED INTERVENTIONS: Therapeutic exercises, Therapeutic activity, Neuromuscular re-education, Balance training, Gait training, Patient/Family education, Self Care, Joint mobilization, Aquatic Therapy, Dry Needling, Cryotherapy, Moist heat, Taping, Vasopneumatic device, Ultrasound, Ionotophoresis 4mg /ml Dexamethasone, Manual therapy, and Re-evaluation  PLAN FOR NEXT SESSION: PROM, AAROM as tolerated per protocol   Sakeena Teall, PT 11/14/2022, 11:00 AM

## 2022-11-17 ENCOUNTER — Encounter: Payer: Self-pay | Admitting: Physical Therapy

## 2022-11-17 ENCOUNTER — Ambulatory Visit: Payer: Managed Care, Other (non HMO) | Admitting: Physical Therapy

## 2022-11-17 DIAGNOSIS — M6281 Muscle weakness (generalized): Secondary | ICD-10-CM | POA: Diagnosis not present

## 2022-11-17 DIAGNOSIS — R29898 Other symptoms and signs involving the musculoskeletal system: Secondary | ICD-10-CM

## 2022-11-17 DIAGNOSIS — M25511 Pain in right shoulder: Secondary | ICD-10-CM

## 2022-11-17 NOTE — Therapy (Signed)
OUTPATIENT PHYSICAL THERAPY SHOULDER    Patient Name: Darren Crawford MRN: [id number] DOB:[birth year], 37 y.o., male Today's Date: 11/17/2022  END OF SESSION:  PT End of Session - 11/17/22 1138     Visit Number 4    Number of Visits 16    Date for PT Re-Evaluation 01/03/23    Authorization Time Period Needs Auth after 8 visits    Authorization - Visit Number 4    Authorization - Number of Visits 8    PT Start Time 1055    PT Stop Time 1138    PT Time Calculation (min) 43 min    Activity Tolerance Patient tolerated treatment well    Behavior During Therapy [facility] for tasks assessed/performed                Past Medical History:  Diagnosis Date   Anxiety    Asthma    Chronic allergic rhinitis 08/03/2016   Chronic right shoulder pain    Morbid obesity (HCC) 08/03/2016   Obstructive sleep apnea syndrome 08/03/2016   Past Surgical History:  Procedure Laterality Date   HERNIA REPAIR     SHOULDER ARTHROSCOPY WITH DISTAL CLAVICLE RESECTION Right 11/03/2022   Procedure: SHOULDER ARTHROSCOPY WITH DISTAL CLAVICLE EXCISION;  Surgeon: Bjorn Pippin, MD;  Location: [facility];  Service: Orthopedics;  Laterality: Right;   SHOULDER ARTHROSCOPY WITH SUBACROMIAL DECOMPRESSION AND BICEP TENDON REPAIR Right 11/03/2022   Procedure: SHOULDER ARTHROSCOPY WITH SUBACROMIAL DECOMPRESSION AND BICEP TENDON REPAIR;  Surgeon: Bjorn Pippin, MD;  Location: [facility];  Service: Orthopedics;  Laterality: Right;   TOE SURGERY     Patient Active Problem List   Diagnosis Date Noted   Right knee injury 06/16/2022   Labral tear of shoulder, right, initial encounter 12/13/2017   Chronic allergic rhinitis 08/03/2016   Morbid obesity (HCC) 08/03/2016   Obstructive sleep apnea syndrome 08/03/2016    PCP: Wyline Mood  REFERRING PROVIDER: Alfonse Alpers  REFERRING DIAG: Rt subacromial decompression, distal clavicle resection, biceps tenodesis repair  THERAPY DIAG:  Muscle  weakness (generalized)  Acute pain of right shoulder  Other symptoms and signs involving the musculoskeletal system  Rationale for Evaluation and Treatment: Rehabilitation  ONSET DATE: 11/03/22  SUBJECTIVE:                                                                                                                                                                                      SUBJECTIVE STATEMENT: Pt reports nothing new since last visit   PERTINENT HISTORY: Pt is s/p Rt subacromial decompression, distal clavicle resection, biceps tenodesis repair on 11/03/22. He states some pain at night, he has  been icing at home.  Rt shoulder labral tear  PAIN:  Are you having pain?  0/10 currently, 5/10 at worst  PRECAUTIONS: Other: shoulder - see protocol  WEIGHT BEARING RESTRICTIONS: No  FALLS:  Has patient fallen in last 6 months? No   OCCUPATION: Interior and spatial designer  PLOF: Independent  PATIENT GOALS:return to normal function  NEXT MD VISIT:   OBJECTIVE:    UPPER EXTREMITY ROM:   Passive ROM Right eval Left eval  Shoulder flexion 53   Shoulder extension    Shoulder abduction 60   Shoulder adduction    Shoulder internal rotation [facility]   Shoulder external rotation 0   Elbow flexion WFL   Elbow extension -15   Wrist flexion    Wrist extension    Wrist ulnar deviation    Wrist radial deviation    Wrist pronation    Wrist supination    (Blank rows = not tested)  UPPER EXTREMITY MMT: Not assessed on eval due to precautions MMT Right eval Left eval  Shoulder flexion    Shoulder extension    Shoulder abduction    Shoulder adduction    Shoulder internal rotation    Shoulder external rotation    Middle trapezius    Lower trapezius    Elbow flexion    Elbow extension    Wrist flexion    Wrist extension    Wrist ulnar deviation    Wrist radial deviation    Wrist pronation    Wrist supination    Grip strength (lbs)    (Blank rows = not  tested)   PALPATION:  Mild edema throughout Rt shoulder   TODAY'S TREATMENT:                                                                                                                                         [facility] Adult PT Treatment:                                                DATE: 11/17/22 Therapeutic Exercise: Pendulum A/P, laterally, CW/CCW all x 10 Elbow AAROM flex/ext with dowel x 15 Shoulder abd AAROM with dowel x 15 Shoulder ER AAROM with dowel x 15 AAROM shoulder alphabet ball on table Pulley flexion AAROM x 2 min Supine shoulder flexion AAROM with dowel to tolerance Manual Therapy: PROM Rt shoulder as tolerated per protocol STM Rt bicep  [facility] Adult PT Treatment:                                                DATE: 11/14/22 Therapeutic Exercise: Pendulum A/P, laterally, CW/CCW all x  10 Elbow AAROM flex/ext with dowel x 15 Shoulder abd AAROM with dowel x 15 Small range shoulder AAROM with dowel x 15 Shoulder ER AAROM dowel x 15 Manual Therapy: PROM Rt shoulder as tolerated per protocol   [facility] Adult PT Treatment:                                                DATE: 11/11/22 Therapeutic Exercise: Pendulum A/P, laterally, CW/CCW all x 10 (before and after manual work) Scap squeeze x 12 Elbow AAROM flex/ext with dowel x 15 Shoulder abd AAROM with dowel x 15 Shoulder ER AAROM dowel x 15 Manual Therapy: PROM Rt shoulder as tolerated per protocol    PATIENT EDUCATION: Education details: PT POC and goals, HEP Person educated: Patient Education method: Explanation, Demonstration, and Handouts Education comprehension: verbalized understanding and returned demonstration  HOME EXERCISE PROGRAM: Access Code: [email or web address] URL: [email or web address] Date: 11/11/2022 Prepared by: Reggy Eye  Exercises - Circular Shoulder Pendulum with Table Support  - 1 x daily - 7 x weekly - 3 sets - 10 reps - Flexion-Extension Shoulder Pendulum with Table Support  -  1 x daily - 7 x weekly - 3 sets - 10 reps - Horizontal Shoulder Pendulum with Table Support  - 1 x daily - 7 x weekly - 3 sets - 10 reps - Elbow Extension PROM  - 1 x daily - 7 x weekly - 3 sets - 10 reps - Seated Scapular Retraction  - 1 x daily - 7 x weekly - 3 sets - 10 reps - Standing Shoulder External Rotation AAROM with Dowel  - 1 x daily - 7 x weekly - 3 sets - 10 reps - Standing Shoulder Abduction AAROM with Dowel  - 1 x daily - 7 x weekly - 3 sets - 10 reps  ASSESSMENT:  CLINICAL IMPRESSION: Pt improved tolerance to ER PROM, still difficulty tolerating shoulder flexion. Good response to new exercises this visit  OBJECTIVE IMPAIRMENTS: decreased activity tolerance, decreased ROM, decreased strength, increased edema, increased muscle spasms, impaired UE functional use, and postural dysfunction.     GOALS: Goals reviewed with patient? Yes  SHORT TERM GOALS: Target date: 11/22/2022    Pt will be independent with initial HEP Baseline: Goal status: INITIAL   LONG TERM GOALS: Target date: 01/03/2023    Pt will be independent with advanced HEP Baseline:  Goal status: INITIAL  2.  Pt will improve FOTO to >= 70 to demo improved functional mobility Baseline:  Goal status: INITIAL  3.  Pt will improve Rt shoulder flexion and abduction ROM to 160 degrees to improve functional activity tolerance Baseline:  Goal status: INITIAL  4.  Pt will improve Rt UE strength to 4+/5 to progress to PLOF Baseline:  Goal status: INITIAL  5.  Pt will perform ADLs and IADLS with pain <= 2/10 Baseline:  Goal status: INITIAL   PLAN:  PT FREQUENCY: 2x/week  PT DURATION: 8 weeks  PLANNED INTERVENTIONS: Therapeutic exercises, Therapeutic activity, Neuromuscular re-education, Balance training, Gait training, Patient/Family education, Self Care, Joint mobilization, Aquatic Therapy, Dry Needling, Cryotherapy, Moist heat, Taping, Vasopneumatic device, Ultrasound, Ionotophoresis 4mg /ml  Dexamethasone, Manual therapy, and Re-evaluation  PLAN FOR NEXT SESSION: PROM, AAROM, AROM as tolerated per protocol   Caraline Deutschman, PT 11/17/2022, 11:39 AM

## 2022-11-21 NOTE — Therapy (Signed)
OUTPATIENT PHYSICAL THERAPY SHOULDER    Patient Name: Darren Crawford MRN: [id number] DOB:1986/06/02, 37 y.o., male Today's Date: 11/22/2022  END OF SESSION:  PT End of Session - 11/22/22 1102     Visit Number 5    Number of Visits 16    Date for PT Re-Evaluation 01/03/23    Authorization Type [employer]    Authorization Time Period Needs Auth after 8 visits    Authorization - Visit Number 5    Authorization - Number of Visits 8    PT Start Time 1102    PT Stop Time 1142    PT Time Calculation (min) 40 min    Activity Tolerance Patient tolerated treatment well    Behavior During Therapy [facility] for tasks assessed/performed                 Past Medical History:  Diagnosis Date   Anxiety    Asthma    Chronic allergic rhinitis 08/03/2016   Chronic right shoulder pain    Morbid obesity (HCC) 08/03/2016   Obstructive sleep apnea syndrome 08/03/2016   Past Surgical History:  Procedure Laterality Date   HERNIA REPAIR     SHOULDER ARTHROSCOPY WITH DISTAL CLAVICLE RESECTION Right 11/03/2022   Procedure: SHOULDER ARTHROSCOPY WITH DISTAL CLAVICLE EXCISION;  Surgeon: Bjorn Pippin, MD;  Location: [facility];  Service: Orthopedics;  Laterality: Right;   SHOULDER ARTHROSCOPY WITH SUBACROMIAL DECOMPRESSION AND BICEP TENDON REPAIR Right 11/03/2022   Procedure: SHOULDER ARTHROSCOPY WITH SUBACROMIAL DECOMPRESSION AND BICEP TENDON REPAIR;  Surgeon: Bjorn Pippin, MD;  Location: [facility];  Service: Orthopedics;  Laterality: Right;   TOE SURGERY     Patient Active Problem List   Diagnosis Date Noted   Right knee injury 06/16/2022   Labral tear of shoulder, right, initial encounter 12/13/2017   Chronic allergic rhinitis 08/03/2016   Morbid obesity (HCC) 08/03/2016   Obstructive sleep apnea syndrome 08/03/2016    PCP: Wyline Mood  REFERRING PROVIDER: Alfonse Alpers  REFERRING DIAG: Rt subacromial decompression, distal clavicle resection, biceps tenodesis  repair  THERAPY DIAG:  Muscle weakness (generalized)  Acute pain of right shoulder  Other symptoms and signs involving the musculoskeletal system  Rationale for Evaluation and Treatment: Rehabilitation  ONSET DATE: 11/03/22  SUBJECTIVE:                                                                                                                                                                                      SUBJECTIVE STATEMENT: No new complaints.    PERTINENT HISTORY: Pt is s/p Rt subacromial decompression, distal clavicle resection, biceps tenodesis repair on 11/03/22. He states some pain  at night, he has been icing at home.  Rt shoulder labral tear  PAIN:  Are you having pain?  0/10 currently, 5/10 at worst  PRECAUTIONS: Other: shoulder - see protocol  WEIGHT BEARING RESTRICTIONS: No  FALLS:  Has patient fallen in last 6 months? No   OCCUPATION: Interior and spatial designer  PLOF: Independent  PATIENT GOALS:return to normal function  NEXT MD VISIT:   OBJECTIVE:    UPPER EXTREMITY ROM:   Passive ROM Right eval Left eval  Shoulder flexion 53   Shoulder extension    Shoulder abduction 60   Shoulder adduction    Shoulder internal rotation [facility]   Shoulder external rotation 0   Elbow flexion WFL   Elbow extension -15   Wrist flexion    Wrist extension    Wrist ulnar deviation    Wrist radial deviation    Wrist pronation    Wrist supination    (Blank rows = not tested)  UPPER EXTREMITY MMT: Not assessed on eval due to precautions MMT Right eval Left eval  Shoulder flexion    Shoulder extension    Shoulder abduction    Shoulder adduction    Shoulder internal rotation    Shoulder external rotation    Middle trapezius    Lower trapezius    Elbow flexion    Elbow extension    Wrist flexion    Wrist extension    Wrist ulnar deviation    Wrist radial deviation    Wrist pronation    Wrist supination    Grip strength (lbs)    (Blank rows = not  tested)   PALPATION:  Mild edema throughout Rt shoulder   TODAY'S TREATMENT:                                                                                                                                         [facility] Adult PT Treatment:                                                DATE: 11/22/22 Therapeutic Exercise: Pendulum A/P, laterally, CW/CCW all x 10 Elbow AAROM flex/ext with dowel x 15 Shoulder abd AAROM with dowel x 15 Shoulder ER AAROM with dowel x 15 AAROM shoulder alphabet ball on table Pulley flexion AAROM x 2 min Supine shoulder flexion AAROM with dowel to tolerance Manual Therapy: PROM Rt shoulder as tolerated per protocol   [facility] Adult PT Treatment:                                                DATE: 11/17/22 Therapeutic Exercise: Pendulum A/P, laterally, CW/CCW  all x 10 Elbow AAROM flex/ext with dowel x 15 Shoulder abd AAROM with dowel x 15 Shoulder ER AAROM with dowel x 15 AAROM shoulder alphabet ball on table Pulley flexion AAROM x 2 min Supine shoulder flexion AAROM with dowel to tolerance Manual Therapy: PROM Rt shoulder as tolerated per protocol STM Rt bicep  [facility] Adult PT Treatment:                                                DATE: 11/14/22 Therapeutic Exercise: Pendulum A/P, laterally, CW/CCW all x 10 Elbow AAROM flex/ext with dowel x 15 Shoulder abd AAROM with dowel x 15 Small range shoulder AAROM with dowel x 15 Shoulder ER AAROM dowel x 15 Manual Therapy: PROM Rt shoulder as tolerated per protocol   [facility] Adult PT Treatment:                                                DATE: 11/11/22 Therapeutic Exercise: Pendulum A/P, laterally, CW/CCW all x 10 (before and after manual work) Scap squeeze x 12 Elbow AAROM flex/ext with dowel x 15 Shoulder abd AAROM with dowel x 15 Shoulder ER AAROM dowel x 15 Manual Therapy: PROM Rt shoulder as tolerated per protocol    PATIENT EDUCATION: Education details: HEP update Person educated:  Patient Education method: Explanation, Demonstration, and Handouts Education comprehension: verbalized understanding and returned demonstration  HOME EXERCISE PROGRAM: Access Code: [email or web address] URL: [email or web address] Date: 11/22/2022 Prepared by: Raynelle Fanning  Exercises - Circular Shoulder Pendulum with Table Support  - 1 x daily - 7 x weekly - 3 sets - 10 reps - Flexion-Extension Shoulder Pendulum with Table Support  - 1 x daily - 7 x weekly - 3 sets - 10 reps - Horizontal Shoulder Pendulum with Table Support  - 1 x daily - 7 x weekly - 3 sets - 10 reps - Elbow Extension PROM  - 1 x daily - 7 x weekly - 3 sets - 10 reps - Seated Scapular Retraction  - 1 x daily - 7 x weekly - 3 sets - 10 reps - Standing Shoulder External Rotation AAROM with Dowel  - 1 x daily - 7 x weekly - 3 sets - 10 reps - Standing Shoulder Abduction AAROM with Dowel  - 1 x daily - 7 x weekly - 3 sets - 10 reps - Supine Shoulder Flexion AAROM with Dowel  - 1 x daily - 7 x weekly - 1-3 sets - 10 reps  ASSESSMENT:  CLINICAL IMPRESSION: Wilver continues to demonstrate increased tolerance to TE and PROM. He is independent and compliant with HEP.  OBJECTIVE IMPAIRMENTS: decreased activity tolerance, decreased ROM, decreased strength, increased edema, increased muscle spasms, impaired UE functional use, and postural dysfunction.     GOALS: Goals reviewed with patient? Yes  SHORT TERM GOALS: Target date: 11/22/2022    Pt will be independent with initial HEP Baseline: Goal status: MET   LONG TERM GOALS: Target date: 01/03/2023    Pt will be independent with advanced HEP Baseline:  Goal status: INITIAL  2.  Pt will improve FOTO to >= 70 to demo improved functional mobility Baseline:  Goal status: INITIAL  3.  Pt will improve Rt shoulder flexion and abduction  ROM to 160 degrees to improve functional activity tolerance Baseline:  Goal status: INITIAL  4.  Pt will improve Rt UE strength to 4+/5 to  progress to PLOF Baseline:  Goal status: INITIAL  5.  Pt will perform ADLs and IADLS with pain <= 2/10 Baseline:  Goal status: INITIAL   PLAN:  PT FREQUENCY: 2x/week  PT DURATION: 8 weeks  PLANNED INTERVENTIONS: Therapeutic exercises, Therapeutic activity, Neuromuscular re-education, Balance training, Gait training, Patient/Family education, Self Care, Joint mobilization, Aquatic Therapy, Dry Needling, Cryotherapy, Moist heat, Taping, Vasopneumatic device, Ultrasound, Ionotophoresis 4mg /ml Dexamethasone, Manual therapy, and Re-evaluation  PLAN FOR NEXT SESSION: PROM, AAROM, AROM as tolerated per protocol   Deontay Ladnier, PT 11/22/2022, 11:43 AM

## 2022-11-22 ENCOUNTER — Ambulatory Visit: Payer: Managed Care, Other (non HMO) | Admitting: Physical Therapy

## 2022-11-22 ENCOUNTER — Encounter: Payer: Self-pay | Admitting: Physical Therapy

## 2022-11-22 DIAGNOSIS — M6281 Muscle weakness (generalized): Secondary | ICD-10-CM

## 2022-11-22 DIAGNOSIS — R29898 Other symptoms and signs involving the musculoskeletal system: Secondary | ICD-10-CM

## 2022-11-22 DIAGNOSIS — M25511 Pain in right shoulder: Secondary | ICD-10-CM

## 2022-11-23 NOTE — Therapy (Signed)
OUTPATIENT PHYSICAL THERAPY SHOULDER    Patient Name: Darren Crawford MRN: [id number] DOB:1985-10-22, 37 y.o., male Today's Date: 11/24/2022  END OF SESSION:  PT End of Session - 11/24/22 1103     Visit Number 6    Number of Visits 16    Date for PT Re-Evaluation 01/03/23    Authorization Type [employer]    Authorization - Visit Number 6    Authorization - Number of Visits 8    PT Start Time 1103    PT Stop Time 1146    PT Time Calculation (min) 43 min    Activity Tolerance Patient tolerated treatment well    Behavior During Therapy [facility] for tasks assessed/performed                 Past Medical History:  Diagnosis Date   Anxiety    Asthma    Chronic allergic rhinitis 08/03/2016   Chronic right shoulder pain    Morbid obesity (HCC) 08/03/2016   Obstructive sleep apnea syndrome 08/03/2016   Past Surgical History:  Procedure Laterality Date   HERNIA REPAIR     SHOULDER ARTHROSCOPY WITH DISTAL CLAVICLE RESECTION Right 11/03/2022   Procedure: SHOULDER ARTHROSCOPY WITH DISTAL CLAVICLE EXCISION;  Surgeon: Bjorn Pippin, MD;  Location: [facility];  Service: Orthopedics;  Laterality: Right;   SHOULDER ARTHROSCOPY WITH SUBACROMIAL DECOMPRESSION AND BICEP TENDON REPAIR Right 11/03/2022   Procedure: SHOULDER ARTHROSCOPY WITH SUBACROMIAL DECOMPRESSION AND BICEP TENDON REPAIR;  Surgeon: Bjorn Pippin, MD;  Location: [facility];  Service: Orthopedics;  Laterality: Right;   TOE SURGERY     Patient Active Problem List   Diagnosis Date Noted   Right knee injury 06/16/2022   Labral tear of shoulder, right, initial encounter 12/13/2017   Chronic allergic rhinitis 08/03/2016   Morbid obesity (HCC) 08/03/2016   Obstructive sleep apnea syndrome 08/03/2016    PCP: Wyline Mood  REFERRING PROVIDER: Alfonse Alpers  REFERRING DIAG: Rt subacromial decompression, distal clavicle resection, biceps tenodesis repair  THERAPY DIAG:  Muscle weakness  (generalized)  Acute pain of right shoulder  Other symptoms and signs involving the musculoskeletal system  Rationale for Evaluation and Treatment: Rehabilitation  ONSET DATE: 11/03/22  SUBJECTIVE:                                                                                                                                                                                      SUBJECTIVE STATEMENT: No new complaints. Still icing after exercises.   PERTINENT HISTORY: Pt is s/p Rt subacromial decompression, distal clavicle resection, biceps tenodesis repair on 11/03/22. He states some pain at night, he has been icing at home.  Rt shoulder labral tear  PAIN:  Are you having pain?  0/10 currently, 5/10 at worst  PRECAUTIONS: Other: shoulder - see protocol  WEIGHT BEARING RESTRICTIONS: No  FALLS:  Has patient fallen in last 6 months? No   OCCUPATION: Interior and spatial designer  PLOF: Independent  PATIENT GOALS:return to normal function  NEXT MD VISIT: 12/02/22  OBJECTIVE:    UPPER EXTREMITY ROM:   Passive ROM Right eval Left eval Right 11/24/22  Shoulder flexion 53  124  Shoulder extension     Shoulder abduction 60  85  Shoulder adduction     Shoulder internal rotation WFL    Shoulder external rotation 0  15 @ 60 deg ABD  Elbow flexion WFL    Elbow extension -15  full  Wrist flexion     Wrist extension     Wrist ulnar deviation     Wrist radial deviation     Wrist pronation     Wrist supination     (Blank rows = not tested)  UPPER EXTREMITY MMT: Not assessed on eval due to precautions MMT Right eval Left eval  Shoulder flexion    Shoulder extension    Shoulder abduction    Shoulder adduction    Shoulder internal rotation    Shoulder external rotation    Middle trapezius    Lower trapezius    Elbow flexion    Elbow extension    Wrist flexion    Wrist extension    Wrist ulnar deviation    Wrist radial deviation    Wrist pronation    Wrist supination     Grip strength (lbs)    (Blank rows = not tested)   PALPATION:  Mild edema throughout Rt shoulder   TODAY'S TREATMENT:                                                                                                                                          [facility] Adult PT Treatment:                                                DATE: 11/24/22 Therapeutic Exercise: Pendulum A/P, laterally, CW/CCW all x 10 Elbow AAROM flex/ext with dowel x 15 Shoulder abd AAROM with dowel x 15 Shoulder ER AAROM with dowel x 15 AAROM shoulder alphabet ball on table Pulley flexion AAROM x 2 min, then x 1 min (PT assist of scapular motion) Supine shoulder flexion AAROM with dowel to tolerance Manual Therapy: PROM Rt shoulder as tolerated per protocol  [facility] Adult PT Treatment:  DATE: 11/22/22 Therapeutic Exercise: Pendulum A/P, laterally, CW/CCW all x 10 Elbow AAROM flex/ext with dowel x 15 Shoulder abd AAROM with dowel x 15 Shoulder ER AAROM with dowel x 15 AAROM shoulder alphabet ball on table Pulley flexion AAROM x 2 min Supine shoulder flexion AAROM with dowel to tolerance Manual Therapy: PROM Rt shoulder as tolerated per protocol   [facility] Adult PT Treatment:                                                DATE: 11/17/22 Therapeutic Exercise: Pendulum A/P, laterally, CW/CCW all x 10 Elbow AAROM flex/ext with dowel x 15 Shoulder abd AAROM with dowel x 15 Shoulder ER AAROM with dowel x 15 AAROM shoulder alphabet ball on table Pulley flexion AAROM x 2 min Supine shoulder flexion AAROM with dowel to tolerance Manual Therapy: PROM Rt shoulder as tolerated per protocol STM Rt bicep   PATIENT EDUCATION: Education details: HEP update Person educated: Patient Education method: Explanation, Demonstration, and Handouts Education comprehension: verbalized understanding and returned demonstration  HOME EXERCISE PROGRAM: Access Code: [email or web address] URL:  [email or web address] Date: 11/22/2022 Prepared by: Raynelle Fanning  Exercises - Circular Shoulder Pendulum with Table Support  - 1 x daily - 7 x weekly - 3 sets - 10 reps - Flexion-Extension Shoulder Pendulum with Table Support  - 1 x daily - 7 x weekly - 3 sets - 10 reps - Horizontal Shoulder Pendulum with Table Support  - 1 x daily - 7 x weekly - 3 sets - 10 reps - Elbow Extension PROM  - 1 x daily - 7 x weekly - 3 sets - 10 reps - Seated Scapular Retraction  - 1 x daily - 7 x weekly - 3 sets - 10 reps - Standing Shoulder External Rotation AAROM with Dowel  - 1 x daily - 7 x weekly - 3 sets - 10 reps - Standing Shoulder Abduction AAROM with Dowel  - 1 x daily - 7 x weekly - 3 sets - 10 reps - Supine Shoulder Flexion AAROM with Dowel  - 1 x daily - 7 x weekly - 1-3 sets - 10 reps  ASSESSMENT:  CLINICAL IMPRESSION: Paulo is progressing with ROM in all planes. Returns to MD on 12/02/22. He continues to demonstrate potential for improvement and would benefit from continued skilled therapy to address impairments.    OBJECTIVE IMPAIRMENTS: decreased activity tolerance, decreased ROM, decreased strength, increased edema, increased muscle spasms, impaired UE functional use, and postural dysfunction.     GOALS: Goals reviewed with patient? Yes  SHORT TERM GOALS: Target date: 11/22/2022    Pt will be independent with initial HEP Baseline: Goal status: MET   LONG TERM GOALS: Target date: 01/03/2023    Pt will be independent with advanced HEP Baseline:  Goal status: INITIAL  2.  Pt will improve FOTO to >= 70 to demo improved functional mobility Baseline:  Goal status: INITIAL  3.  Pt will improve Rt shoulder flexion and abduction ROM to 160 degrees to improve functional activity tolerance Baseline:  Goal status: INITIAL  4.  Pt will improve Rt UE strength to 4+/5 to progress to PLOF Baseline:  Goal status: INITIAL  5.  Pt will perform ADLs and IADLS with pain <=  2/10 Baseline:  Goal status: INITIAL   PLAN:  PT FREQUENCY: 2x/week  PT DURATION: 8 weeks  PLANNED INTERVENTIONS: Therapeutic exercises, Therapeutic activity, Neuromuscular re-education, Balance training, Gait training, Patient/Family education, Self Care, Joint mobilization, Aquatic Therapy, Dry Needling, Cryotherapy, Moist heat, Taping, Vasopneumatic device, Ultrasound, Ionotophoresis 4mg /ml Dexamethasone, Manual therapy, and Re-evaluation  PLAN FOR NEXT SESSION: PROM, AAROM, AROM as tolerated per protocol   Simonne Boulos, PT 11/24/2022, 11:50 AM

## 2022-11-24 ENCOUNTER — Ambulatory Visit: Payer: Managed Care, Other (non HMO) | Admitting: Physical Therapy

## 2022-11-24 ENCOUNTER — Encounter: Payer: Self-pay | Admitting: Physical Therapy

## 2022-11-24 DIAGNOSIS — R29898 Other symptoms and signs involving the musculoskeletal system: Secondary | ICD-10-CM

## 2022-11-24 DIAGNOSIS — M25511 Pain in right shoulder: Secondary | ICD-10-CM

## 2022-11-24 DIAGNOSIS — M6281 Muscle weakness (generalized): Secondary | ICD-10-CM

## 2022-11-29 ENCOUNTER — Ambulatory Visit: Payer: Managed Care, Other (non HMO) | Admitting: Physical Therapy

## 2022-11-29 ENCOUNTER — Encounter: Payer: Self-pay | Admitting: Physical Therapy

## 2022-11-29 DIAGNOSIS — M6281 Muscle weakness (generalized): Secondary | ICD-10-CM

## 2022-11-29 DIAGNOSIS — R29898 Other symptoms and signs involving the musculoskeletal system: Secondary | ICD-10-CM

## 2022-11-29 DIAGNOSIS — M25511 Pain in right shoulder: Secondary | ICD-10-CM

## 2022-11-29 NOTE — Therapy (Signed)
OUTPATIENT PHYSICAL THERAPY SHOULDER    Patient Name: Darren Crawford MRN: [id number] DOB:August 19, 1985, 37 y.o., male Today's Date: 11/29/2022  END OF SESSION:  PT End of Session - 11/29/22 1314     Visit Number 7    Number of Visits 16    Date for PT Re-Evaluation 01/03/23    Authorization Type [employer]    Authorization Time Period Needs Auth after 8 visits    Authorization - Visit Number 7    Authorization - Number of Visits 8    PT Start Time 1315    PT Stop Time 1400    PT Time Calculation (min) 45 min    Activity Tolerance Patient tolerated treatment well    Behavior During Therapy [facility] for tasks assessed/performed                 Past Medical History:  Diagnosis Date   Anxiety    Asthma    Chronic allergic rhinitis 08/03/2016   Chronic right shoulder pain    Morbid obesity (HCC) 08/03/2016   Obstructive sleep apnea syndrome 08/03/2016   Past Surgical History:  Procedure Laterality Date   HERNIA REPAIR     SHOULDER ARTHROSCOPY WITH DISTAL CLAVICLE RESECTION Right 11/03/2022   Procedure: SHOULDER ARTHROSCOPY WITH DISTAL CLAVICLE EXCISION;  Surgeon: Bjorn Pippin, MD;  Location: [facility];  Service: Orthopedics;  Laterality: Right;   SHOULDER ARTHROSCOPY WITH SUBACROMIAL DECOMPRESSION AND BICEP TENDON REPAIR Right 11/03/2022   Procedure: SHOULDER ARTHROSCOPY WITH SUBACROMIAL DECOMPRESSION AND BICEP TENDON REPAIR;  Surgeon: Bjorn Pippin, MD;  Location: [facility];  Service: Orthopedics;  Laterality: Right;   TOE SURGERY     Patient Active Problem List   Diagnosis Date Noted   Right knee injury 06/16/2022   Labral tear of shoulder, right, initial encounter 12/13/2017   Chronic allergic rhinitis 08/03/2016   Morbid obesity (HCC) 08/03/2016   Obstructive sleep apnea syndrome 08/03/2016    PCP: Wyline Mood  REFERRING PROVIDER: Alfonse Alpers  REFERRING DIAG: Rt subacromial decompression, distal clavicle resection, biceps tenodesis  repair  THERAPY DIAG:  Muscle weakness (generalized)  Acute pain of right shoulder  Other symptoms and signs involving the musculoskeletal system  Rationale for Evaluation and Treatment: Rehabilitation  ONSET DATE: 11/03/22  SUBJECTIVE:                                                                                                                                                                                      SUBJECTIVE STATEMENT: Pt with no new complaints. States he is heading out of town for 2 weeks and may not be able to make Fridays appointment  PERTINENT HISTORY: Pt is s/p Rt subacromial decompression, distal clavicle resection, biceps tenodesis repair on 11/03/22. He states some pain at night, he has been icing at home.  Rt shoulder labral tear  PAIN:  Are you having pain?  0/10 currently, 5/10 at worst  PRECAUTIONS: Other: shoulder - see protocol  WEIGHT BEARING RESTRICTIONS: No  FALLS:  Has patient fallen in last 6 months? No   OCCUPATION: Interior and spatial designer  PLOF: Independent  PATIENT GOALS:return to normal function  NEXT MD VISIT: 12/02/22  OBJECTIVE:    UPPER EXTREMITY ROM:   Passive ROM Right eval Left eval Right 11/24/22  Shoulder flexion 53  124  Shoulder extension     Shoulder abduction 60  85  Shoulder adduction     Shoulder internal rotation WFL    Shoulder external rotation 0  15 @ 60 deg ABD  Elbow flexion WFL    Elbow extension -15  full  Wrist flexion     Wrist extension     Wrist ulnar deviation     Wrist radial deviation     Wrist pronation     Wrist supination     (Blank rows = not tested)  UPPER EXTREMITY MMT: Not assessed on eval due to precautions MMT Right eval Left eval  Shoulder flexion    Shoulder extension    Shoulder abduction    Shoulder adduction    Shoulder internal rotation    Shoulder external rotation    Middle trapezius    Lower trapezius    Elbow flexion    Elbow extension    Wrist flexion     Wrist extension    Wrist ulnar deviation    Wrist radial deviation    Wrist pronation    Wrist supination    Grip strength (lbs)    (Blank rows = not tested)   PALPATION:  Mild edema throughout Rt shoulder   TODAY'S TREATMENT:                                                                                                                                         [facility] Adult PT Treatment:                                                DATE: 11/29/22 Therapeutic Exercise: Pulley flexion x 2 min Pendulum A/P, laterally, CW/CCW all x 10 Reactive isometrics green TB x 10: row, shoulder flexion, ER, IR, abd Shoulder abd AAROM with dowel x 15 Shoulder ER AAROM with dowel x 15 Supine shoulder flexion AAROM with dowel to tolerance Table slide abduction and ER Manual Therapy: PROM Rt shoulder as tolerated   [facility] Adult PT Treatment:  DATE: 11/24/22 Therapeutic Exercise: Pendulum A/P, laterally, CW/CCW all x 10 Elbow AAROM flex/ext with dowel x 15 Shoulder abd AAROM with dowel x 15 Shoulder ER AAROM with dowel x 15 AAROM shoulder alphabet ball on table Pulley flexion AAROM x 2 min, then x 1 min (PT assist of scapular motion) Supine shoulder flexion AAROM with dowel to tolerance Manual Therapy: PROM Rt shoulder as tolerated per protocol  [facility] Adult PT Treatment:                                                DATE: 11/22/22 Therapeutic Exercise: Pendulum A/P, laterally, CW/CCW all x 10 Elbow AAROM flex/ext with dowel x 15 Shoulder abd AAROM with dowel x 15 Shoulder ER AAROM with dowel x 15 AAROM shoulder alphabet ball on table Pulley flexion AAROM x 2 min Supine shoulder flexion AAROM with dowel to tolerance Manual Therapy: PROM Rt shoulder as tolerated per protocol   PATIENT EDUCATION: Education details: HEP update Person educated: Patient Education method: Explanation, Demonstration, and Handouts Education comprehension: verbalized  understanding and returned demonstration  HOME EXERCISE PROGRAM: Access Code: [email or web address] URL: [email or web address] Date: 11/22/2022 Prepared by: Raynelle Fanning  Exercises - Circular Shoulder Pendulum with Table Support  - 1 x daily - 7 x weekly - 3 sets - 10 reps - Flexion-Extension Shoulder Pendulum with Table Support  - 1 x daily - 7 x weekly - 3 sets - 10 reps - Horizontal Shoulder Pendulum with Table Support  - 1 x daily - 7 x weekly - 3 sets - 10 reps - Elbow Extension PROM  - 1 x daily - 7 x weekly - 3 sets - 10 reps - Seated Scapular Retraction  - 1 x daily - 7 x weekly - 3 sets - 10 reps - Standing Shoulder External Rotation AAROM with Dowel  - 1 x daily - 7 x weekly - 3 sets - 10 reps - Standing Shoulder Abduction AAROM with Dowel  - 1 x daily - 7 x weekly - 3 sets - 10 reps - Supine Shoulder Flexion AAROM with Dowel  - 1 x daily - 7 x weekly - 1-3 sets - 10 reps  ASSESSMENT:  CLINICAL IMPRESSION: Reactive isometrics added per protocol. Pt given HEP to progress while he is on vacation. PT educated pt on importance of ROM and maintaining and improving flexion ROM while away   OBJECTIVE IMPAIRMENTS: decreased activity tolerance, decreased ROM, decreased strength, increased edema, increased muscle spasms, impaired UE functional use, and postural dysfunction.     GOALS: Goals reviewed with patient? Yes  SHORT TERM GOALS: Target date: 11/22/2022    Pt will be independent with initial HEP Baseline: Goal status: MET   LONG TERM GOALS: Target date: 01/03/2023    Pt will be independent with advanced HEP Baseline:  Goal status: INITIAL  2.  Pt will improve FOTO to >= 70 to demo improved functional mobility Baseline:  Goal status: INITIAL  3.  Pt will improve Rt shoulder flexion and abduction ROM to 160 degrees to improve functional activity tolerance Baseline:  Goal status: INITIAL  4.  Pt will improve Rt UE strength to 4+/5 to progress to PLOF Baseline:   Goal status: INITIAL  5.  Pt will perform ADLs and IADLS with pain <= 2/10 Baseline:  Goal status: INITIAL   PLAN:  PT FREQUENCY: 2x/week  PT  DURATION: 8 weeks  PLANNED INTERVENTIONS: Therapeutic exercises, Therapeutic activity, Neuromuscular re-education, Balance training, Gait training, Patient/Family education, Self Care, Joint mobilization, Aquatic Therapy, Dry Needling, Cryotherapy, Moist heat, Taping, Vasopneumatic device, Ultrasound, Ionotophoresis 4mg /ml Dexamethasone, Manual therapy, and Re-evaluation  PLAN FOR NEXT SESSION: isometrics, PROM, AAROM, AROM as tolerated per protocol   Elmira Olkowski, PT 11/29/2022, 1:59 PM

## 2022-12-02 ENCOUNTER — Encounter: Payer: Managed Care, Other (non HMO) | Admitting: Physical Therapy

## 2022-12-13 ENCOUNTER — Encounter: Payer: Self-pay | Admitting: Family Medicine

## 2023-01-03 NOTE — Therapy (Signed)
OUTPATIENT PHYSICAL THERAPY SHOULDER TREATMENT AND RECERTIFICATION   Patient Name: Darren Crawford MRN: [id number] DOB:1986/03/31, 37 y.o., male Today's Date: 01/03/2023  END OF SESSION:        Past Medical History:  Diagnosis Date   Anxiety    Asthma    Chronic allergic rhinitis 08/03/2016   Chronic right shoulder pain    Morbid obesity (HCC) 08/03/2016   Obstructive sleep apnea syndrome 08/03/2016   Past Surgical History:  Procedure Laterality Date   HERNIA REPAIR     SHOULDER ARTHROSCOPY WITH DISTAL CLAVICLE RESECTION Right 11/03/2022   Procedure: SHOULDER ARTHROSCOPY WITH DISTAL CLAVICLE EXCISION;  Surgeon: Bjorn Pippin, MD;  Location: [facility];  Service: Orthopedics;  Laterality: Right;   SHOULDER ARTHROSCOPY WITH SUBACROMIAL DECOMPRESSION AND BICEP TENDON REPAIR Right 11/03/2022   Procedure: SHOULDER ARTHROSCOPY WITH SUBACROMIAL DECOMPRESSION AND BICEP TENDON REPAIR;  Surgeon: Bjorn Pippin, MD;  Location: [facility];  Service: Orthopedics;  Laterality: Right;   TOE SURGERY     Patient Active Problem List   Diagnosis Date Noted   Right knee injury 06/16/2022   Labral tear of shoulder, right, initial encounter 12/13/2017   Chronic allergic rhinitis 08/03/2016   Morbid obesity (HCC) 08/03/2016   Obstructive sleep apnea syndrome 08/03/2016    PCP: Wyline Mood  REFERRING PROVIDER: Alfonse Alpers  REFERRING DIAG: Rt subacromial decompression, distal clavicle resection, biceps tenodesis repair  THERAPY DIAG:  No diagnosis found.  Rationale for Evaluation and Treatment: Rehabilitation  ONSET DATE: 11/03/22  SUBJECTIVE:                                                                                                                                                                                      SUBJECTIVE STATEMENT: Mainly just soreness now. Especially if do dishes or something.    PERTINENT HISTORY: Pt is s/p Rt subacromial  decompression, distal clavicle resection, biceps tenodesis repair on 11/03/22. He states some pain at night, he has been icing at home.  Rt shoulder labral tear  PAIN:  Are you having pain?  0/10 currently, 5/10 at worst  PRECAUTIONS: Other: shoulder - see protocol  WEIGHT BEARING RESTRICTIONS: No  FALLS:  Has patient fallen in last 6 months? No   OCCUPATION: Teacher and pastor  PLOF: Independent  PATIENT GOALS:return to normal function  NEXT MD VISIT: 01/24/23  OBJECTIVE:    UPPER EXTREMITY ROM:   Passive ROM A/PROM Right eval Left eval Right 11/24/22 Right 01/04/23  Shoulder flexion 53  124 147/162  Shoulder extension      Shoulder abduction 60  85 123/136  Shoulder adduction      Shoulder internal  rotation [facility]   75/85 ; F 4 inch lower than L  Shoulder external rotation 0  15 @ 60 deg ABD 74/85  Elbow flexion WFL     Elbow extension -15  full   (Blank rows = not tested)  UPPER EXTREMITY MMT: Not assessed on eval or recert (5# restriction) due to precautions MMT Right eval Left eval  Shoulder flexion    Shoulder extension    Shoulder abduction    Shoulder adduction    Shoulder internal rotation    Shoulder external rotation    Middle trapezius    Lower trapezius    Elbow flexion    Elbow extension    (Blank rows = not tested)   PALPATION:  Mild edema throughout Rt shoulder   TODAY'S TREATMENT:                                                                                                                                         [facility] Adult PT Treatment:                                                DATE: 01/04/23 Therapeutic Exercise: Pulley flexion x 2 min Therapeutic activities: RECERT - goals assessed/FOTO IR stretch with strap 2 x 30 sec Flexion stretch in doorway x 30 sec some pain in post/sup shoulder, 2nd and 3rd reps shorter hold to tolerance ABD stretch doorway x 3 ER 90/90 doorway stretch 2x30 sec Reactive isometrics green TB x 5 ea:  flexion, ER, IR, abd Blue row x 15 Active ER/IR/flex GTB x 10 ea Standing flex/scap/ABD 1# x 10 ea  [facility] Adult PT Treatment:                                                DATE: 11/29/22 Therapeutic Exercise: Pulley flexion x 2 min Pendulum A/P, laterally, CW/CCW all x 10 Reactive isometrics green TB x 10: row, shoulder flexion, ER, IR, abd Shoulder abd AAROM with dowel x 15 Shoulder ER AAROM with dowel x 15 Supine shoulder flexion AAROM with dowel to tolerance Table slide abduction and ER Manual Therapy: PROM Rt shoulder as tolerated   [facility] Adult PT Treatment:                                                DATE: 11/24/22 Therapeutic Exercise: Pendulum A/P, laterally, CW/CCW all x 10 Elbow AAROM flex/ext with dowel x 15 Shoulder abd AAROM with dowel x 15 Shoulder ER AAROM with  dowel x 15 AAROM shoulder alphabet ball on table Pulley flexion AAROM x 2 min, then x 1 min (PT assist of scapular motion) Supine shoulder flexion AAROM with dowel to tolerance Manual Therapy: PROM Rt shoulder as tolerated per protocol  [facility] Adult PT Treatment:                                                DATE: 11/22/22 Therapeutic Exercise: Pendulum A/P, laterally, CW/CCW all x 10 Elbow AAROM flex/ext with dowel x 15 Shoulder abd AAROM with dowel x 15 Shoulder ER AAROM with dowel x 15 AAROM shoulder alphabet ball on table Pulley flexion AAROM x 2 min Supine shoulder flexion AAROM with dowel to tolerance Manual Therapy: PROM Rt shoulder as tolerated per protocol   PATIENT EDUCATION: Education details: HEP update Person educated: Patient Education method: Explanation, Demonstration, and Handouts Education comprehension: verbalized understanding and returned demonstration  HOME EXERCISE PROGRAM: Access Code: [email or web address] URL: [email or web address] Date: 01/04/2023 Prepared by: Raynelle Fanning  Exercises - Circular Shoulder Pendulum with Table Support  - 1 x daily - 7 x weekly - 3 sets - 10  reps - Flexion-Extension Shoulder Pendulum with Table Support  - 1 x daily - 7 x weekly - 3 sets - 10 reps - Horizontal Shoulder Pendulum with Table Support  - 1 x daily - 7 x weekly - 3 sets - 10 reps - Elbow Extension PROM  - 1 x daily - 7 x weekly - 3 sets - 10 reps - Seated Scapular Retraction  - 1 x daily - 7 x weekly - 3 sets - 10 reps - Standing Shoulder External Rotation AAROM with Dowel  - 1 x daily - 7 x weekly - 3 sets - 10 reps - Standing Shoulder Abduction AAROM with Dowel  - 1 x daily - 7 x weekly - 3 sets - 10 reps - Supine Shoulder Flexion AAROM with Dowel  - 1 x daily - 7 x weekly - 1-3 sets - 10 reps - Shoulder External Rotation Reactive Isometrics  - 1 x daily - 7 x weekly - 3 sets - 10 reps - Shoulder Internal Rotation Reactive Isometrics  - 1 x daily - 7 x weekly - 3 sets - 10 reps - Standing Shoulder Flexion Reactive Isometric  - 1 x daily - 7 x weekly - 3 sets - 10 reps - Standing Shoulder Row Reactive Isometric  - 1 x daily - 7 x weekly - 3 sets - 10 reps - Standing Shoulder Abduction Reactive Isometrics with Elbow Extended  - 1 x daily - 7 x weekly - 3 sets - 10 reps - Standing Shoulder Flexion to 90 Degrees  - 1 x daily - 3 x weekly - 1-2 sets - 10 reps - 3 sec hold - Standing Shoulder Scaption  - 1 x daily - 3 x weekly - 1-2 sets - 10 reps - 3 sec hold - Shoulder Abduction - Thumbs Up  - 1 x daily - 3 x weekly - 1-2 sets - 10 reps - 3 sec hold - Standing Shoulder Internal Rotation Stretch with Towel  - 1 x daily - 7 x weekly - 1 sets - 3 reps - 30 sec hold - Standing Single Arm Shoulder Flexion Stretch on Wall (Mirrored)  - 1 x daily - 3-4 x weekly - 1 sets - 3 reps -  20 sec hold  ASSESSMENT:  CLINICAL IMPRESSION: Darren Crawford is progressing well within his protocol limits. He still has tightness in R shoulder flex and ABD limiting ROM and function. He demonstrates expected weakness in R shoulder, but is progressing with strengthening. He has been out of town and  just returned to PT. He will benefit from skilled PT to address his remaining deficits and return him to his PLOF.    OBJECTIVE IMPAIRMENTS: decreased activity tolerance, decreased ROM, decreased strength, increased edema, increased muscle spasms, impaired UE functional use, and postural dysfunction.     GOALS: Goals reviewed with patient? Yes  SHORT TERM GOALS: Target date: 11/22/2022    Pt will be independent with initial HEP Baseline: Goal status: MET   LONG TERM GOALS: Target date: 01/03/2023 extended to 02/01/23    Pt will be independent with advanced HEP Baseline:  Goal status: IN PROGRESS 01/04/23  2.  Pt will improve FOTO to >= 70 to demo improved functional mobility Baseline:  Goal status: IN PROGRESS  01/04/23 44  3.  Pt will improve Rt shoulder flexion and abduction ROM to 160 degrees to improve functional activity tolerance Baseline:  Goal status: IN PROGRESS  4.  Pt will improve Rt UE strength to 4+/5 to progress to PLOF Baseline:  Goal status: IN PROGRESS  5.  Pt will perform ADLs and IADLS with pain <= 2/10 Baseline:  Goal status: MET   PLAN:  PT FREQUENCY: 2x/week  PT DURATION: 4 weeks  PLANNED INTERVENTIONS: Therapeutic exercises, Therapeutic activity, Neuromuscular re-education, Balance training, Gait training, Patient/Family education, Self Care, Joint mobilization, Aquatic Therapy, Dry Needling, Cryotherapy, Moist heat, Taping, Vasopneumatic device, Ultrasound, Ionotophoresis 4mg /ml Dexamethasone, Manual therapy, and Re-evaluation  PLAN FOR NEXT SESSION: strengthening per protocol, work to get full [employer], PT  01/03/2023, 8:38 PM

## 2023-01-04 ENCOUNTER — Ambulatory Visit: Payer: Managed Care, Other (non HMO) | Attending: Orthopaedic Surgery | Admitting: Physical Therapy

## 2023-01-04 ENCOUNTER — Encounter: Payer: Self-pay | Admitting: Physical Therapy

## 2023-01-04 DIAGNOSIS — M25511 Pain in right shoulder: Secondary | ICD-10-CM | POA: Insufficient documentation

## 2023-01-04 DIAGNOSIS — M6281 Muscle weakness (generalized): Secondary | ICD-10-CM | POA: Insufficient documentation

## 2023-01-04 DIAGNOSIS — R29898 Other symptoms and signs involving the musculoskeletal system: Secondary | ICD-10-CM | POA: Diagnosis present

## 2023-01-08 NOTE — Therapy (Signed)
OUTPATIENT PHYSICAL THERAPY SHOULDER TREATMENT    Patient Name: Darren Crawford MRN: [id number] DOB:[birth year], 37 y.o., male Today's Date: 01/09/2023  END OF SESSION:  PT End of Session - 01/09/23 0850     Visit Number 9    Number of Visits 16    Date for PT Re-Evaluation 02/01/23    Authorization Type [employer]    PT Start Time 0846    PT Stop Time 0927    PT Time Calculation (min) 41 min    Activity Tolerance Patient tolerated treatment well    Behavior During Therapy [facility] for tasks assessed/performed                  Past Medical History:  Diagnosis Date   Anxiety    Asthma    Chronic allergic rhinitis 08/03/2016   Chronic right shoulder pain    Morbid obesity (HCC) 08/03/2016   Obstructive sleep apnea syndrome 08/03/2016   Past Surgical History:  Procedure Laterality Date   HERNIA REPAIR     SHOULDER ARTHROSCOPY WITH DISTAL CLAVICLE RESECTION Right 11/03/2022   Procedure: SHOULDER ARTHROSCOPY WITH DISTAL CLAVICLE EXCISION;  Surgeon: Bjorn Pippin, MD;  Location: [facility];  Service: Orthopedics;  Laterality: Right;   SHOULDER ARTHROSCOPY WITH SUBACROMIAL DECOMPRESSION AND BICEP TENDON REPAIR Right 11/03/2022   Procedure: SHOULDER ARTHROSCOPY WITH SUBACROMIAL DECOMPRESSION AND BICEP TENDON REPAIR;  Surgeon: Bjorn Pippin, MD;  Location: [facility];  Service: Orthopedics;  Laterality: Right;   TOE SURGERY     Patient Active Problem List   Diagnosis Date Noted   Right knee injury 06/16/2022   Labral tear of shoulder, right, initial encounter 12/13/2017   Chronic allergic rhinitis 08/03/2016   Morbid obesity (HCC) 08/03/2016   Obstructive sleep apnea syndrome 08/03/2016    PCP: Wyline Mood  REFERRING PROVIDER: Alfonse Alpers  REFERRING DIAG: Rt subacromial decompression, distal clavicle resection, biceps tenodesis repair  THERAPY DIAG:  Muscle weakness (generalized)  Acute pain of right shoulder  Other symptoms and signs  involving the musculoskeletal system  Rationale for Evaluation and Treatment: Rehabilitation  ONSET DATE: 11/03/22  SUBJECTIVE:                                                                                                                                                                                      SUBJECTIVE STATEMENT: No new problems. Still having pin point pain at top of shoulder with certain movements.   PERTINENT HISTORY: Pt is s/p Rt subacromial decompression, distal clavicle resection, biceps tenodesis repair on 11/03/22. He states some pain at night, he has been icing at home.  Rt shoulder labral tear  PAIN:  Are you having pain?  0/10 currently, 5/10 at worst  PRECAUTIONS: Other: shoulder - see protocol  WEIGHT BEARING RESTRICTIONS: No  FALLS:  Has patient fallen in last 6 months? No   OCCUPATION: Teacher and pastor  PLOF: Independent  PATIENT GOALS:return to normal function  NEXT MD VISIT: 01/24/23  OBJECTIVE:    UPPER EXTREMITY ROM:   Passive ROM A/PROM Right eval Left eval Right 11/24/22 Right 01/04/23  Shoulder flexion 53  124 147/162  Shoulder extension      Shoulder abduction 60  85 123/136  Shoulder adduction      Shoulder internal rotation WFL   75/85 ; F 4 inch lower than L  Shoulder external rotation 0  15 @ 60 deg ABD 74/85  Elbow flexion WFL     Elbow extension -15  full   (Blank rows = not tested)  UPPER EXTREMITY MMT: Not assessed on eval or recert (5# restriction) due to precautions MMT Right eval Left eval  Shoulder flexion    Shoulder extension    Shoulder abduction    Shoulder adduction    Shoulder internal rotation    Shoulder external rotation    Middle trapezius    Lower trapezius    Elbow flexion    Elbow extension    (Blank rows = not tested)   PALPATION:  Mild edema throughout Rt shoulder   TODAY'S TREATMENT:         [facility] Adult PT Treatment:                                                DATE:  01/09/23 Therapeutic Exercise: Pulley flexion x 2 min IR stretch with strap intermittent pulls x 60 sec Flexion stretch in doorway 2 x 30  ABD stretch doorway 2x30 sec ER 90/90 doorway stretch 2x30 sec Standing flex/scap/ABD 1# x 10 ea SDLY ER 1# wt 2x10 with 3 sec hold and slow eccentric lowering SDLY horizontal ABD 1# 2x 10 SDLY ABD 1# 1x10 Prone ext 1# 2x 10 Supine serratus push 1# 1x10, 2# 1x10 Supine Horizontal ABD 1# 2x10                                                                                                                                   [facility] Adult PT Treatment:                                                DATE: 01/04/23 Therapeutic Exercise: Pulley flexion x 2 min Therapeutic activities: RECERT - goals assessed/FOTO IR stretch with strap 2 x 30 sec Flexion stretch in doorway x 30 sec some pain in post/sup shoulder, 2nd  and 3rd reps shorter hold to tolerance ABD stretch doorway x 3 ER 90/90 doorway stretch 2x30 sec Reactive isometrics green TB x 5 ea: flexion, ER, IR, abd Blue row x 15 Active ER/IR/flex GTB x 10 ea Standing flex/scap/ABD 1# x 10 ea  [facility] Adult PT Treatment:                                                DATE: 11/29/22 Therapeutic Exercise: Pulley flexion x 2 min Pendulum A/P, laterally, CW/CCW all x 10 Reactive isometrics green TB x 10: row, shoulder flexion, ER, IR, abd Shoulder abd AAROM with dowel x 15 Shoulder ER AAROM with dowel x 15 Supine shoulder flexion AAROM with dowel to tolerance Table slide abduction and ER Manual Therapy: PROM Rt shoulder as tolerated   PATIENT EDUCATION: Education details: HEP update Person educated: Patient Education method: Explanation, Demonstration, and Handouts Education comprehension: verbalized understanding and returned demonstration  HOME EXERCISE PROGRAM: Access Code: [email or web address] URL: [email or web address] Date: 01/09/2023 Prepared by: Raynelle Fanning  Exercises - Circular Shoulder  Pendulum with Table Support  - 1 x daily - 7 x weekly - 3 sets - 10 reps - Flexion-Extension Shoulder Pendulum with Table Support  - 1 x daily - 7 x weekly - 3 sets - 10 reps - Horizontal Shoulder Pendulum with Table Support  - 1 x daily - 7 x weekly - 3 sets - 10 reps - Elbow Extension PROM  - 1 x daily - 7 x weekly - 3 sets - 10 reps - Seated Scapular Retraction  - 1 x daily - 7 x weekly - 3 sets - 10 reps - Standing Shoulder External Rotation AAROM with Dowel  - 1 x daily - 7 x weekly - 3 sets - 10 reps - Standing Shoulder Abduction AAROM with Dowel  - 1 x daily - 7 x weekly - 3 sets - 10 reps - Supine Shoulder Flexion AAROM with Dowel  - 1 x daily - 7 x weekly - 1-3 sets - 10 reps - Shoulder External Rotation Reactive Isometrics  - 1 x daily - 7 x weekly - 3 sets - 10 reps - Shoulder Internal Rotation Reactive Isometrics  - 1 x daily - 7 x weekly - 3 sets - 10 reps - Standing Shoulder Flexion Reactive Isometric  - 1 x daily - 7 x weekly - 3 sets - 10 reps - Standing Shoulder Row Reactive Isometric  - 1 x daily - 7 x weekly - 3 sets - 10 reps - Standing Shoulder Abduction Reactive Isometrics with Elbow Extended  - 1 x daily - 7 x weekly - 3 sets - 10 reps - Standing Shoulder Flexion to 90 Degrees  - 1 x daily - 3 x weekly - 1-2 sets - 10 reps - 3 sec hold - Standing Shoulder Scaption  - 1 x daily - 3 x weekly - 1-2 sets - 10 reps - 3 sec hold - Shoulder Abduction - Thumbs Up  - 1 x daily - 3 x weekly - 1-2 sets - 10 reps - 3 sec hold - Standing Shoulder Internal Rotation Stretch with Towel  - 1 x daily - 7 x weekly - 1 sets - 3 reps - 30 sec hold - Standing Single Arm Shoulder Flexion Stretch on Wall (Mirrored)  - 1 x daily - 3-4 x weekly -  1 sets - 3 reps - 20 sec hold - Sidelying Shoulder ER with Towel and Dumbbell  - 1 x daily - 3-4 x weekly - 1-3 sets - 10 reps - 3 sec hold  ASSESSMENT:  CLINICAL IMPRESSION: Or[place]is tolerated progression of TE well. Definitely fatigues, but no pain  reported. Good scapular control in standing flex/scap/ABD with no elevation of traps.    OBJECTIVE IMPAIRMENTS: decreased activity tolerance, decreased ROM, decreased strength, increased edema, increased muscle spasms, impaired UE functional use, and postural dysfunction.     GOALS: Goals reviewed with patient? Yes  SHORT TERM GOALS: Target date: 11/22/2022    Pt will be independent with initial HEP Baseline: Goal status: MET   LONG TERM GOALS: Target date: 01/03/2023 extended to 02/01/23    Pt will be independent with advanced HEP Baseline:  Goal status: IN PROGRESS 01/04/23  2.  Pt will improve FOTO to >= 70 to demo improved functional mobility Baseline:  Goal status: IN PROGRESS  01/04/23 44  3.  Pt will improve Rt shoulder flexion and abduction ROM to 160 degrees to improve functional activity tolerance Baseline:  Goal status: IN PROGRESS  4.  Pt will improve Rt UE strength to 4+/5 to progress to PLOF Baseline:  Goal status: IN PROGRESS  5.  Pt will perform ADLs and IADLS with pain <= 2/10 Baseline:  Goal status: MET   PLAN:  PT FREQUENCY: 2x/week  PT DURATION: 4 weeks  PLANNED INTERVENTIONS: Therapeutic exercises, Therapeutic activity, Neuromuscular re-education, Balance training, Gait training, Patient/Family education, Self Care, Joint mobilization, Aquatic Therapy, Dry Needling, Cryotherapy, Moist heat, Taping, Vasopneumatic device, Ultrasound, Ionotophoresis 4mg /ml Dexamethasone, Manual therapy, and Re-evaluation  PLAN FOR NEXT SESSION: strengthening per protocol, work to get full [employer], PT  01/09/2023, 9:28 AM

## 2023-01-09 ENCOUNTER — Ambulatory Visit: Payer: Managed Care, Other (non HMO) | Admitting: Physical Therapy

## 2023-01-09 ENCOUNTER — Encounter: Payer: Self-pay | Admitting: Physical Therapy

## 2023-01-09 DIAGNOSIS — M6281 Muscle weakness (generalized): Secondary | ICD-10-CM

## 2023-01-09 DIAGNOSIS — R29898 Other symptoms and signs involving the musculoskeletal system: Secondary | ICD-10-CM

## 2023-01-09 DIAGNOSIS — M25511 Pain in right shoulder: Secondary | ICD-10-CM

## 2023-01-11 ENCOUNTER — Ambulatory Visit: Payer: Managed Care, Other (non HMO) | Admitting: Physical Therapy

## 2023-01-11 ENCOUNTER — Encounter: Payer: Self-pay | Admitting: Physical Therapy

## 2023-01-11 DIAGNOSIS — M25511 Pain in right shoulder: Secondary | ICD-10-CM

## 2023-01-11 DIAGNOSIS — M6281 Muscle weakness (generalized): Secondary | ICD-10-CM

## 2023-01-11 DIAGNOSIS — R29898 Other symptoms and signs involving the musculoskeletal system: Secondary | ICD-10-CM

## 2023-01-11 NOTE — Therapy (Addendum)
OUTPATIENT PHYSICAL THERAPY SHOULDER TREATMENT AND DISCHARGE   Patient Name: Darren Crawford MRN: [id number] DOB:[birth year], 37 y.o., male Today's Date: 01/11/2023  END OF SESSION:         Past Medical History:  Diagnosis Date   Anxiety    Asthma    Chronic allergic rhinitis 08/03/2016   Chronic right shoulder pain    Morbid obesity (HCC) 08/03/2016   Obstructive sleep apnea syndrome 08/03/2016   Past Surgical History:  Procedure Laterality Date   HERNIA REPAIR     SHOULDER ARTHROSCOPY WITH DISTAL CLAVICLE RESECTION Right 11/03/2022   Procedure: SHOULDER ARTHROSCOPY WITH DISTAL CLAVICLE EXCISION;  Surgeon: Bjorn Pippin, MD;  Location: [facility];  Service: Orthopedics;  Laterality: Right;   SHOULDER ARTHROSCOPY WITH SUBACROMIAL DECOMPRESSION AND BICEP TENDON REPAIR Right 11/03/2022   Procedure: SHOULDER ARTHROSCOPY WITH SUBACROMIAL DECOMPRESSION AND BICEP TENDON REPAIR;  Surgeon: Bjorn Pippin, MD;  Location: [facility];  Service: Orthopedics;  Laterality: Right;   TOE SURGERY     Patient Active Problem List   Diagnosis Date Noted   Right knee injury 06/16/2022   Labral tear of shoulder, right, initial encounter 12/13/2017   Chronic allergic rhinitis 08/03/2016   Morbid obesity (HCC) 08/03/2016   Obstructive sleep apnea syndrome 08/03/2016    PCP: Wyline Mood  REFERRING PROVIDER: Alfonse Alpers  REFERRING DIAG: Rt subacromial decompression, distal clavicle resection, biceps tenodesis repair  THERAPY DIAG:  No diagnosis found.  Rationale for Evaluation and Treatment: Rehabilitation  ONSET DATE: 11/03/22  SUBJECTIVE:                                                                                                                                                                                      SUBJECTIVE STATEMENT: Sore yesterday but better today.   PERTINENT HISTORY: Pt is s/p Rt subacromial decompression, distal clavicle  resection, biceps tenodesis repair on 11/03/22. He states some pain at night, he has been icing at home.  Rt shoulder labral tear  PAIN:  Are you having pain?  0/10 currently, 5/10 at worst  PRECAUTIONS: Other: shoulder - see protocol  WEIGHT BEARING RESTRICTIONS: No  FALLS:  Has patient fallen in last 6 months? No   OCCUPATION: Teacher and pastor  PLOF: Independent  PATIENT GOALS:return to normal function  NEXT MD VISIT: 01/24/23  OBJECTIVE:    UPPER EXTREMITY ROM:   Passive ROM A/PROM Right eval Left eval Right 11/24/22 Right 01/04/23  Shoulder flexion 53  124 147/162  Shoulder extension      Shoulder abduction 60  85 123/136  Shoulder adduction      Shoulder internal rotation WFL   75/85 ;  F 4 inch lower than L  Shoulder external rotation 0  15 @ 60 deg ABD 74/85  Elbow flexion WFL     Elbow extension -15  full   (Blank rows = not tested)  UPPER EXTREMITY MMT: Not assessed on eval or recert (5# restriction) due to precautions MMT Right eval Left eval  Shoulder flexion    Shoulder extension    Shoulder abduction    Shoulder adduction    Shoulder internal rotation    Shoulder external rotation    Middle trapezius    Lower trapezius    Elbow flexion    Elbow extension    (Blank rows = not tested)   PALPATION:  Mild edema throughout Rt shoulder   TODAY'S TREATMENT:         [facility] Adult PT Treatment:                                                DATE: 01/11/23 Therapeutic Exercise: Pulley flexion/ABD x 2 min IR stretch with strap intermittent pulls x 60 sec ABD stretch doorway 2x30 sec ER 90/90 doorway stretch 2x30 sec Standing flex/scap/ABD 1# 2x 10 ea SDLY ER 1# wt 2x10 with 3 sec hold and slow eccentric lowering SDLY horizontal ABD 1# 2x 10 Prone ext 1# 2x 10 Supine serratus push pain today so held Manual: STM and TPR to subscap, lats and pectoralis Supine IR stretch x 20 sec Ice x 10 min at end    [facility] Adult PT Treatment:                                                 DATE: 01/09/23 Therapeutic Exercise: Pulley flexion x 2 min IR stretch with strap intermittent pulls x 60 sec Flexion stretch in doorway 2 x 30  ABD stretch doorway 2x30 sec ER 90/90 doorway stretch 2x30 sec Standing flex/scap/ABD 1# x 10 ea SDLY ER 1# wt 2x10 with 3 sec hold and slow eccentric lowering SDLY horizontal ABD 1# 2x 10 SDLY ABD 1# 1x10 Prone ext 1# 2x 10 Supine serratus push 1# 1x10, 2# 1x10 Supine Horizontal ABD 1# 2x10                                                                                                                                   [facility] Adult PT Treatment:                                                DATE: 01/04/23 Therapeutic Exercise: Pulley flexion  x 2 min Therapeutic activities: RECERT - goals assessed/FOTO IR stretch with strap 2 x 30 sec Flexion stretch in doorway x 30 sec some pain in post/sup shoulder, 2nd and 3rd reps shorter hold to tolerance ABD stretch doorway x 3 ER 90/90 doorway stretch 2x30 sec Reactive isometrics green TB x 5 ea: flexion, ER, IR, abd Blue row x 15 Active ER/IR/flex GTB x 10 ea Standing flex/scap/ABD 1# x 10 ea  PATIENT EDUCATION: Education details: HEP update Person educated: Patient Education method: Explanation, Demonstration, and Handouts Education comprehension: verbalized understanding and returned demonstration  HOME EXERCISE PROGRAM: Access Code: [email or web address] URL: [email or web address] Date: 01/09/2023 Prepared by: Raynelle Fanning  Exercises - Circular Shoulder Pendulum with Table Support  - 1 x daily - 7 x weekly - 3 sets - 10 reps - Flexion-Extension Shoulder Pendulum with Table Support  - 1 x daily - 7 x weekly - 3 sets - 10 reps - Horizontal Shoulder Pendulum with Table Support  - 1 x daily - 7 x weekly - 3 sets - 10 reps - Elbow Extension PROM  - 1 x daily - 7 x weekly - 3 sets - 10 reps - Seated Scapular Retraction  - 1 x daily - 7 x weekly - 3 sets - 10 reps -  Standing Shoulder External Rotation AAROM with Dowel  - 1 x daily - 7 x weekly - 3 sets - 10 reps - Standing Shoulder Abduction AAROM with Dowel  - 1 x daily - 7 x weekly - 3 sets - 10 reps - Supine Shoulder Flexion AAROM with Dowel  - 1 x daily - 7 x weekly - 1-3 sets - 10 reps - Shoulder External Rotation Reactive Isometrics  - 1 x daily - 7 x weekly - 3 sets - 10 reps - Shoulder Internal Rotation Reactive Isometrics  - 1 x daily - 7 x weekly - 3 sets - 10 reps - Standing Shoulder Flexion Reactive Isometric  - 1 x daily - 7 x weekly - 3 sets - 10 reps - Standing Shoulder Row Reactive Isometric  - 1 x daily - 7 x weekly - 3 sets - 10 reps - Standing Shoulder Abduction Reactive Isometrics with Elbow Extended  - 1 x daily - 7 x weekly - 3 sets - 10 reps - Standing Shoulder Flexion to 90 Degrees  - 1 x daily - 3 x weekly - 1-2 sets - 10 reps - 3 sec hold - Standing Shoulder Scaption  - 1 x daily - 3 x weekly - 1-2 sets - 10 reps - 3 sec hold - Shoulder Abduction - Thumbs Up  - 1 x daily - 3 x weekly - 1-2 sets - 10 reps - 3 sec hold - Standing Shoulder Internal Rotation Stretch with Towel  - 1 x daily - 7 x weekly - 1 sets - 3 reps - 30 sec hold - Standing Single Arm Shoulder Flexion Stretch on Wall (Mirrored)  - 1 x daily - 3-4 x weekly - 1 sets - 3 reps - 20 sec hold - Sidelying Shoulder ER with Towel and Dumbbell  - 1 x daily - 3-4 x weekly - 1-3 sets - 10 reps - 3 sec hold  ASSESSMENT:  CLINICAL IMPRESSION: Some pain in R pectoralis after doing flex/scap/ABD today. Pt is also tight and sore in R subscapularis, but responded well to TPR and stretching. Progressing well with strength.    OBJECTIVE IMPAIRMENTS: decreased activity tolerance, decreased ROM, decreased strength, increased edema, increased  muscle spasms, impaired UE functional use, and postural dysfunction.     GOALS: Goals reviewed with patient? Yes  SHORT TERM GOALS: Target date: 11/22/2022    Pt will be independent with  initial HEP Baseline: Goal status: MET   LONG TERM GOALS: Target date: 01/03/2023 extended to 02/01/23    Pt will be independent with advanced HEP Baseline:  Goal status: IN PROGRESS 01/04/23  2.  Pt will improve FOTO to >= 70 to demo improved functional mobility Baseline:  Goal status: IN PROGRESS  01/04/23 44  3.  Pt will improve Rt shoulder flexion and abduction ROM to 160 degrees to improve functional activity tolerance Baseline:  Goal status: IN PROGRESS  4.  Pt will improve Rt UE strength to 4+/5 to progress to PLOF Baseline:  Goal status: IN PROGRESS  5.  Pt will perform ADLs and IADLS with pain <= 2/10 Baseline:  Goal status: MET   PLAN:  PT FREQUENCY: 2x/week  PT DURATION: 4 weeks  PLANNED INTERVENTIONS: Therapeutic exercises, Therapeutic activity, Neuromuscular re-education, Balance training, Gait training, Patient/Family education, Self Care, Joint mobilization, Aquatic Therapy, Dry Needling, Cryotherapy, Moist heat, Taping, Vasopneumatic device, Ultrasound, Ionotophoresis 4mg /ml Dexamethasone, Manual therapy, and Re-evaluation  PLAN FOR NEXT SESSION: strengthening per protocol, work to get full ROM  PHYSICAL THERAPY DISCHARGE SUMMARY  Visits from Start of Care: 10  Current functional level related to goals / functional outcomes: Improved ROM and strength   Remaining deficits: See above   Education / Equipment: HEP   Patient agrees to discharge. Patient goals were partially met. Patient is being discharged due to not returning since the last visit.  Reggy Eye, PT,DPT09/03/243:05 PM   Solon Palm, PT  01/11/2023, 12:38 PM

## 2023-01-27 ENCOUNTER — Ambulatory Visit: Payer: Managed Care, Other (non HMO)

## 2023-01-27 ENCOUNTER — Ambulatory Visit (INDEPENDENT_AMBULATORY_CARE_PROVIDER_SITE_OTHER): Payer: Managed Care, Other (non HMO) | Admitting: Podiatry

## 2023-01-27 ENCOUNTER — Other Ambulatory Visit: Payer: Self-pay | Admitting: Podiatry

## 2023-01-27 ENCOUNTER — Encounter: Payer: Self-pay | Admitting: Podiatry

## 2023-01-27 DIAGNOSIS — M79671 Pain in right foot: Secondary | ICD-10-CM

## 2023-01-27 DIAGNOSIS — G5791 Unspecified mononeuropathy of right lower limb: Secondary | ICD-10-CM

## 2023-01-27 DIAGNOSIS — M19071 Primary osteoarthritis, right ankle and foot: Secondary | ICD-10-CM

## 2023-01-27 DIAGNOSIS — D1631 Benign neoplasm of short bones of right lower limb: Secondary | ICD-10-CM

## 2023-01-27 DIAGNOSIS — Z5189 Encounter for other specified aftercare: Secondary | ICD-10-CM

## 2023-01-27 NOTE — Progress Notes (Signed)
  Subjective:  Patient ID: Darren Crawford, male    DOB: 06-Jul-1985,   MRN: [id number]  Chief Complaint  Patient presents with   Toe Pain    Pt present today for some numbness in his right big toe and pain sometimes.    37 y.o. male presents for concern of right foot pain. Relates years back he has surgery on his right great toe about 16 years ago. Over the past year he has noticed more numbness in his toe sometimes it gets irritated and painful. Relates usually gets painful if he hits the toe a certain way. Believes the surgery he had years ago was because he had broken the toe several times and they needed to remove a portion of bone possible osteochondroma but he is not sure . Denies any other pedal complaints. Denies n/v/f/c.   Past Medical History:  Diagnosis Date   Anxiety    Asthma    Chronic allergic rhinitis 08/03/2016   Chronic right shoulder pain    Morbid obesity (HCC) 08/03/2016   Obstructive sleep apnea syndrome 08/03/2016    Objective:  Physical Exam: Vascular: DP/PT pulses 2/4 bilateral. CFT <3 seconds. Normal hair growth on digits. No edema.  Skin. No lacerations or abrasions bilateral feet.  Musculoskeletal: MMT 5/5 bilateral lower extremities in DF, PF, Inversion and Eversion. Deceased ROM in DF of ankle joint. Mildly tender to medial distal hallux and tender with ROM of the hallux IPJ. No pain with ROM of the first MPJ. Bony prominence felt medial hallux near base of nail plate.  Neurological: Sensation intact to light touch.   Assessment:   1. Osteochondroma of foot, right   2. Arthritis of foot, right   3. Neuritis of foot, right      Plan:  Patient was evaluated and treated and all questions answered. -Xrays reviewed. Bony prominence noted to medial aspect of distal phalanx of hallux with stalk and cartilagenous cap possible osteochondroma. Some very mild degenerative changes noted at hallux IPJ as well.  -Discussed osteochondroma, neuritis vs arthritis and   treatement options; conservative and  Surgical management; risks, benefits, alternatives discussed. All patient's questions answered. -Discussed topical pain creams and anti-inflammatories.  -Discussed gabapentin but symptoms mild for now and will hold off.  -Discussed bony lesion and potentially needing surgery in the future if symptoms worsen.  -Continue with good supportive shoes and inserts. Discussed stiff soled shoes and use of carbon fiber foot plate.   -Patient to return to office as needed or sooner if condition worsens.   Louann Sjogren, DPM

## 2023-02-06 ENCOUNTER — Ambulatory Visit (INDEPENDENT_AMBULATORY_CARE_PROVIDER_SITE_OTHER): Payer: Managed Care, Other (non HMO) | Admitting: Physician Assistant

## 2023-02-06 ENCOUNTER — Encounter (INDEPENDENT_AMBULATORY_CARE_PROVIDER_SITE_OTHER): Payer: Self-pay | Admitting: Physician Assistant

## 2023-02-06 ENCOUNTER — Encounter (INDEPENDENT_AMBULATORY_CARE_PROVIDER_SITE_OTHER): Payer: Self-pay

## 2023-02-06 DIAGNOSIS — G4719 Other hypersomnia: Secondary | ICD-10-CM

## 2023-02-06 DIAGNOSIS — R7401 Elevation of levels of liver transaminase levels: Secondary | ICD-10-CM

## 2023-02-06 DIAGNOSIS — R7302 Impaired glucose tolerance (oral): Secondary | ICD-10-CM

## 2023-02-06 DIAGNOSIS — Z6837 Body mass index (BMI) 37.0-37.9, adult: Secondary | ICD-10-CM

## 2023-02-06 DIAGNOSIS — R5382 Chronic fatigue, unspecified: Secondary | ICD-10-CM

## 2023-02-06 NOTE — Progress Notes (Signed)
Patient went to wrong office. Not seen today.   Shonn Farruggia,PA-C

## 2023-02-06 NOTE — Progress Notes (Deleted)
Patient seen in [place] location.  Darren Ehle,PA-C

## 2023-02-07 ENCOUNTER — Ambulatory Visit: Payer: Managed Care, Other (non HMO) | Admitting: Family Medicine

## 2023-02-07 ENCOUNTER — Encounter: Payer: Self-pay | Admitting: Family Medicine

## 2023-02-07 VITALS — BP 120/82 | HR 63 | Temp 98.6°F | Ht 72.0 in | Wt 277.0 lb

## 2023-02-07 DIAGNOSIS — G4733 Obstructive sleep apnea (adult) (pediatric): Secondary | ICD-10-CM

## 2023-02-07 DIAGNOSIS — Z0289 Encounter for other administrative examinations: Secondary | ICD-10-CM

## 2023-02-07 DIAGNOSIS — R7401 Elevation of levels of liver transaminase levels: Secondary | ICD-10-CM | POA: Diagnosis not present

## 2023-02-07 DIAGNOSIS — Z6837 Body mass index (BMI) 37.0-37.9, adult: Secondary | ICD-10-CM | POA: Diagnosis not present

## 2023-02-07 NOTE — Assessment & Plan Note (Signed)
ALT was elevated on last CMP but he does not have a hx of fatty liver.  Denies heavy intake of ETOH or problems with RUQ pain.  No previous abdominal imaging to review.  Recheck liver enzymes with labs next visit If elevated, will obtain a liver ultrasound

## 2023-02-07 NOTE — Assessment & Plan Note (Signed)
Recently started CPAP for OSA. Aims for 7-8 hrs of sleep at night  Continue use of CPAP nightly and look for improvements in sleep apnea with weight reduction

## 2023-02-07 NOTE — Progress Notes (Signed)
Office: [phone]  /  Fax: [phone]   Initial Visit  Darren Crawford was seen in clinic today to evaluate for obesity. He is interested in losing weight to improve overall health and reduce the risk of weight related complications. He presents today to review program treatment options, initial physical assessment, and evaluation.     He was referred by: PCP  When asked what else they would like to accomplish? He states: Improve existing medical conditions and Improve quality of life  Weight history: played basketball in college and is less physically active now but has a big appetite.  He works as a Paediatric nurse.  Back pain has limited his workouts  When asked how has your weight affected you? He states: Contributed to medical problems, Contributed to orthopedic problems or mobility issues, and Having fatigue  Some associated conditions: OSA  Contributing factors: Reduced physical activity and Eating patterns  Weight promoting medications identified: Steroids  Current nutrition plan: None  Current level of physical activity: NEAT  Current or previous pharmacotherapy: None  Response to medication: Never tried medications   Past medical history includes:   Past Medical History:  Diagnosis Date   Anxiety    Asthma    Chronic allergic rhinitis 08/03/2016   Chronic right shoulder pain    Morbid obesity (HCC) 08/03/2016   Obstructive sleep apnea syndrome 08/03/2016     Objective:   BP 120/82   Pulse 63   Temp 98.6 F (37 C)   Ht 6' (1.829 m)   Wt 277 lb (125.6 kg)   SpO2 98%   BMI 37.57 kg/m  He was weighed on the bioimpedance scale: Body mass index is 37.57 kg/m.  Peak Weight:277 , Body Fat%:36.0, Visceral Fat Rating:18, Weight trend over the last 12 months: Unchanged  General:  Alert, oriented and cooperative. Patient is in no acute distress.  Respiratory: Normal respiratory effort, no problems with respiration noted   Gait: able to ambulate  independently  Mental Status: Normal mood and affect. Normal behavior. Normal judgment and thought content.   DIAGNOSTIC DATA REVIEWED:  BMET    Component Value Date/Time   NA 138 09/02/2022 1013   K 4.4 09/02/2022 1013   CL 101 09/02/2022 1013   CO2 24 09/02/2022 1013   GLUCOSE 88 09/02/2022 1013   BUN 13 09/02/2022 1013   CREATININE 0.96 09/02/2022 1013   CALCIUM 9.8 09/02/2022 1013   Lab Results  Component Value Date   HGBA1C 5.3 09/02/2022   No results found for: "INSULIN" CBC    Component Value Date/Time   WBC 5.3 09/02/2022 1013   RBC 5.05 09/02/2022 1013   HGB 15.7 09/02/2022 1013   HCT 46.7 09/02/2022 1013   PLT 297 09/02/2022 1013   MCV 93 09/02/2022 1013   MCH 31.1 09/02/2022 1013   MCHC 33.6 09/02/2022 1013   RDW 12.9 09/02/2022 1013   Iron/TIBC/Ferritin/ %Sat No results found for: "IRON", "TIBC", "FERRITIN", "IRONPCTSAT" Lipid Panel  No results found for: "CHOL", "TRIG", "HDL", "CHOLHDL", "VLDL", "LDLCALC", "LDLDIRECT" Hepatic Function Panel     Component Value Date/Time   PROT 7.4 09/02/2022 1013   ALBUMIN 4.3 09/02/2022 1013   AST 28 09/02/2022 1013   ALT 55 (H) 09/02/2022 1013   ALKPHOS 48 09/02/2022 1013   BILITOT 0.6 09/02/2022 1013      Component Value Date/Time   TSH 0.952 09/02/2022 1013     Assessment and Plan:   Obstructive sleep apnea syndrome Assessment & Plan: Recently  started CPAP for OSA. Aims for 7-8 hrs of sleep at night  Continue use of CPAP nightly and look for improvements in sleep apnea with weight reduction   Class 2 severe obesity due to excess calories with serious comorbidity and body mass index (BMI) of 37.0 to 37.9 in adult [facility])  Elevated alanine aminotransferase (ALT) level Assessment & Plan: ALT was elevated on last CMP but he does not have a hx of fatty liver.  Denies heavy intake of ETOH or problems with RUQ pain.  No previous abdominal imaging to review.  Recheck liver enzymes with labs next visit If  elevated, will obtain a liver ultrasound         Obesity Treatment / Action Plan:  Patient will work on garnering support from family and friends to begin weight loss journey. Will work on eliminating or reducing the presence of highly palatable, calorie dense foods in the home. Will complete provided nutritional and psychosocial assessment questionnaire before the next appointment. Will be scheduled for indirect calorimetry to determine resting energy expenditure in a fasting state.  This will allow [place] to create a reduced calorie, high-protein meal plan to promote loss of fat mass while preserving muscle mass. Will think about ideas on how to incorporate physical activity into their daily routine. Counseled on the health benefits of losing 5%-15% of total body weight. Will work on improving sleep hygiene and trying to obtain at least 7 hours of sleep. Was counseled on nutritional approaches to weight loss and benefits of reducing processed foods and consuming plant-based foods and high quality protein as part of nutritional weight management. Was counseled on pharmacotherapy and role as an adjunct in weight management.   Obesity Education Performed Today:  He was weighed on the bioimpedance scale and results were discussed and documented in the synopsis.  We discussed obesity as a disease and the importance of a more detailed evaluation of all the factors contributing to the disease.  We discussed the importance of long term lifestyle changes which include nutrition, exercise and behavioral modifications as well as the importance of customizing this to his specific health and social needs.  We discussed the benefits of reaching a healthier weight to alleviate the symptoms of existing conditions and reduce the risks of the biomechanical, metabolic and psychological effects of obesity.  [facility] appears to be in the action stage of change and states they are ready to start intensive  lifestyle modifications and behavioral modifications.  30 minutes was spent today on this visit including the above counseling, pre-visit chart review, and post-visit documentation.  Reviewed by clinician on day of visit: allergies, medications, problem list, medical history, surgical history, family history, social history, and previous encounter notes pertinent to obesity diagnosis.    Seymour Bars, D.O. DABFM, DABOM [facility] [phone] [place], [place] [address] [phone]

## 2023-02-16 ENCOUNTER — Ambulatory Visit: Payer: Managed Care, Other (non HMO) | Admitting: Family Medicine

## 2023-02-16 ENCOUNTER — Other Ambulatory Visit: Payer: Self-pay

## 2023-02-16 ENCOUNTER — Encounter: Payer: Self-pay | Admitting: Family Medicine

## 2023-02-16 VITALS — BP 115/79 | HR 63 | Temp 98.6°F | Ht 72.0 in | Wt 278.0 lb

## 2023-02-16 DIAGNOSIS — R5383 Other fatigue: Secondary | ICD-10-CM

## 2023-02-16 DIAGNOSIS — G4733 Obstructive sleep apnea (adult) (pediatric): Secondary | ICD-10-CM | POA: Diagnosis not present

## 2023-02-16 DIAGNOSIS — Z1331 Encounter for screening for depression: Secondary | ICD-10-CM | POA: Diagnosis not present

## 2023-02-16 DIAGNOSIS — F5089 Other specified eating disorder: Secondary | ICD-10-CM

## 2023-02-16 DIAGNOSIS — K219 Gastro-esophageal reflux disease without esophagitis: Secondary | ICD-10-CM | POA: Diagnosis not present

## 2023-02-16 DIAGNOSIS — E65 Localized adiposity: Secondary | ICD-10-CM | POA: Diagnosis not present

## 2023-02-16 DIAGNOSIS — F32A Depression, unspecified: Secondary | ICD-10-CM | POA: Insufficient documentation

## 2023-02-16 DIAGNOSIS — M519 Unspecified thoracic, thoracolumbar and lumbosacral intervertebral disc disorder: Secondary | ICD-10-CM

## 2023-02-16 DIAGNOSIS — Z6837 Body mass index (BMI) 37.0-37.9, adult: Secondary | ICD-10-CM

## 2023-02-16 DIAGNOSIS — E669 Obesity, unspecified: Secondary | ICD-10-CM

## 2023-02-16 DIAGNOSIS — F3289 Other specified depressive episodes: Secondary | ICD-10-CM

## 2023-02-16 DIAGNOSIS — R0602 Shortness of breath: Secondary | ICD-10-CM

## 2023-02-17 LAB — CBC WITH DIFFERENTIAL/PLATELET
Basophils Absolute: 0.1 10*3/uL (ref 0.0–0.2)
Basos: 1 %
EOS (ABSOLUTE): 0.2 10*3/uL (ref 0.0–0.4)
Eos: 4 %
Hematocrit: 43.8 % (ref 37.5–51.0)
Hemoglobin: 14.3 g/dL (ref 13.0–17.7)
Immature Grans (Abs): 0 10*3/uL (ref 0.0–0.1)
Immature Granulocytes: 0 %
Lymphocytes Absolute: 2.2 10*3/uL (ref 0.7–3.1)
Lymphs: 42 %
MCH: 30.5 pg (ref 26.6–33.0)
MCHC: 32.6 g/dL (ref 31.5–35.7)
MCV: 93 fL (ref 79–97)
Monocytes Absolute: 0.6 10*3/uL (ref 0.1–0.9)
Monocytes: 12 %
Neutrophils Absolute: 2.1 10*3/uL (ref 1.4–7.0)
Neutrophils: 41 %
Platelets: 273 10*3/uL (ref 150–450)
RBC: 4.69 x10E6/uL (ref 4.14–5.80)
RDW: 12.5 % (ref 11.6–15.4)
WBC: 5.1 10*3/uL (ref 3.4–10.8)

## 2023-02-17 LAB — COMPREHENSIVE METABOLIC PANEL
ALT: 69 IU/L — ABNORMAL HIGH (ref 0–44)
AST: 36 IU/L (ref 0–40)
Albumin: 4.1 g/dL (ref 4.1–5.1)
Alkaline Phosphatase: 48 IU/L (ref 44–121)
BUN/Creatinine Ratio: 11 (ref 9–20)
BUN: 10 mg/dL (ref 6–20)
Bilirubin Total: 0.6 mg/dL (ref 0.0–1.2)
CO2: 25 mmol/L (ref 20–29)
Calcium: 9.6 mg/dL (ref 8.7–10.2)
Chloride: 104 mmol/L (ref 96–106)
Creatinine, Ser: 0.87 mg/dL (ref 0.76–1.27)
Globulin, Total: 2.9 g/dL (ref 1.5–4.5)
Glucose: 88 mg/dL (ref 70–99)
Potassium: 4.9 mmol/L (ref 3.5–5.2)
Sodium: 138 mmol/L (ref 134–144)
Total Protein: 7 g/dL (ref 6.0–8.5)
eGFR: 113 mL/min/{1.73_m2} (ref >59)

## 2023-02-17 LAB — LIPID PANEL
Chol/HDL Ratio: 4.2 ratio (ref 0.0–5.0)
Cholesterol, Total: 216 mg/dL — ABNORMAL HIGH (ref 100–199)
HDL: 51 mg/dL (ref >39)
LDL Chol Calc (NIH): 146 mg/dL — ABNORMAL HIGH (ref 0–99)
Triglycerides: 107 mg/dL (ref 0–149)
VLDL Cholesterol Cal: 19 mg/dL (ref 5–40)

## 2023-02-17 LAB — HEMOGLOBIN A1C
Est. average glucose Bld gHb Est-mCnc: 105 mg/dL
Hgb A1c MFr Bld: 5.3 % (ref 4.8–5.6)

## 2023-02-17 LAB — VITAMIN D 25 HYDROXY (VIT D DEFICIENCY, FRACTURES): Vit D, 25-Hydroxy: 30.2 ng/mL (ref 30.0–100.0)

## 2023-02-17 LAB — FOLATE: Folate: >20 ng/mL (ref >3.0)

## 2023-02-17 LAB — VITAMIN B12: Vitamin B-12: >2000 pg/mL — ABNORMAL HIGH (ref 232–1245)

## 2023-02-17 LAB — INSULIN, RANDOM: INSULIN: 13.5 u[IU]/mL (ref 2.6–24.9)

## 2023-02-17 LAB — T4, FREE: Free T4: 1.15 ng/dL (ref 0.82–1.77)

## 2023-02-17 LAB — TSH: TSH: 0.643 u[IU]/mL (ref 0.450–4.500)

## 2023-02-22 NOTE — Progress Notes (Signed)
Chief Complaint:   OBESITY Darren Crawford (MR# [id number]) is a 37 y.o. male who presents for evaluation and treatment of obesity and related comorbidities. Current BMI is Body mass index is 37.7 kg/m. Darren Crawford has been struggling with his weight for many years and has been unsuccessful in either losing weight, maintaining weight loss, or reaching his healthy weight goal.  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  He is married with 3 daughters.  His family is supportive.  He typically skips breakfast.  50 to 60 pounds.  Darren Crawford is currently in the action stage of change and ready to dedicate time achieving and maintaining a healthier weight. Darren Crawford is interested in becoming our patient and working on intensive lifestyle modifications including (but not limited to) diet and exercise for weight loss.  Darren Crawford's habits were reviewed today and are as follows: His family eats meals together, he thinks his family will eat healthier with him, his desired weight loss is 78 lbs, he started gaining weight in middle school, his heaviest weight ever was 285 pounds, he is a picky eater and doesn't like to eat healthier foods, he has significant food cravings issues, he snacks frequently in the evenings, he skips meals frequently, he frequently makes poor food choices, he has problems with excessive hunger, he frequently eats larger portions than normal, and he struggles with emotional eating.  Depression Screen Darren Crawford's Food and Mood (modified PHQ-9) score was 13.  Subjective:   1. Other fatigue Darren Crawford admits to daytime somnolence and admits to waking up still tired. Patient has a history of symptoms of daytime fatigue and morning fatigue. Darren Crawford generally gets 8 hours of sleep per night, and states that he has nightime awakenings. Snoring is present. Apneic episodes are present. Epworth Sleepiness Score is 14.  EKG, NSR 65 bpm without ischemia.  2. SOBOE (shortness of breath on exertion) Darren Crawford notes  increasing shortness of breath with exercising and seems to be worsening over time with weight gain. He notes getting out of breath sooner with activity than he used to. This has not gotten worse recently. Darren Crawford denies shortness of breath at rest or orthopnea.  3. Central adiposity Patient's visceral fat rating high at 18.  Explained to patient increased risk for metabolic disease including CVD, NAFLD and type 2 diabetes mellitus.  4. OSA (obstructive sleep apnea) Epworth score of 14.  Patient recently got a CPAP and getting 7 to 8 hours of sleep per night.  Patient has not yet felt improvements with daytime somnolence.  5. Lumbar disc disease Patient has mostly lower back pain worsened by sleeping and prolonged sitting positions.  Patient limits certain types of exercise.  Patient is not taking OTC medications.  6. Gastroesophageal reflux disease, unspecified whether esophagitis present Patient's GERD has been worsened by obesity and late-night eating.  He is taking OTC famotidine as needed which leads to a.m. nausea.  7. Other depression with emotional eating Bariatric PHQ-9:13.  Patient tends to over consume comfort foods based on mood.  Assessment/Plan:   1. Other fatigue Darren Crawford does feel that his weight is causing his energy to be lower than it should be. Fatigue may be related to obesity, depression or many other causes. Labs will be ordered, and in the meanwhile, Darren Crawford will focus on self care including making healthy food choices, increasing physical activity and focusing on stress reduction.  Update labs today.  - VITAMIN D 25 Hydroxy (Vit-D Deficiency, Fractures) - TSH - T4, free -  Lipid panel - Insulin, random - Hemoglobin A1c - Folate - Comprehensive metabolic panel - Vitamin B12 - CBC with Differential/Platelet  2. SOBOE (shortness of breath on exertion) Darren Crawford does feel that he gets out of breath more easily that he used to when he exercises. Darren Crawford's shortness of breath  appears to be obesity related and exercise induced. He has agreed to work on weight loss and gradually increase exercise to treat his exercise induced shortness of breath. Will continue to monitor closely.  3. Central adiposity Anticipate improvements with weight loss.  4. OSA (obstructive sleep apnea) Look for improvements in OSA with weight loss.  5. Lumbar disc disease Adjust exercise plan based on lower back pain and look for improvements with weight loss.  6. Gastroesophageal reflux disease, unspecified whether esophagitis present Look for improvements with dietary change and weight loss.  7. Other depression with emotional eating Patient will work on stress reduction techniques.  8. Depression screen Darren Crawford had a positive depression screening. Depression is commonly associated with obesity and often results in emotional eating behaviors. We will monitor this closely and work on CBT to help improve the non-hunger eating patterns. Referral to Psychology may be required if no improvement is seen as he continues in our clinic.  9. BMI 37.0-37.9, adult  10. Generalized obesity with starting BMI 37.8 Darren Crawford is currently in the action stage of change and his goal is to continue with weight loss efforts. I recommend Darren Crawford begin the structured treatment plan as follows:  He has agreed to the Category 4 Plan.  100-calorie snack list given.  Exercise goals: As is.  Behavioral modification strategies: increasing lean protein intake, decreasing simple carbohydrates, increasing water intake, decreasing liquid calories, increasing high fiber foods, decreasing eating out, no skipping meals, meal planning and cooking strategies, keeping healthy foods in the home, ways to avoid night time snacking, better snacking choices, and avoiding temptations.  He was informed of the importance of frequent follow-up visits to maximize his success with intensive lifestyle modifications for his multiple health  conditions. He was informed we would discuss his lab results at his next visit unless there is a critical issue that needs to be addressed sooner. Darren Crawford agreed to keep his next visit at the agreed upon time to discuss these results.  Objective:   Blood pressure 115/79, pulse 63, temperature 98.6 F (37 C), height 6' (1.829 m), weight 278 lb (126.1 kg), SpO2 99%. Body mass index is 37.7 kg/m.  EKG: Normal sinus rhythm, rate 65 bpm.  Indirect Calorimeter completed today shows a VO2 of 303 and a REE of 2088.  His calculated basal metabolic rate is 1610 thus his basal metabolic rate is worse than expected.  General: Cooperative, alert, well developed, in no acute distress. HEENT: Conjunctivae and lids unremarkable. Cardiovascular: Regular rhythm.  Lungs: Normal work of breathing. Neurologic: No focal deficits.   Lab Results  Component Value Date   CREATININE 0.87 02/16/2023   BUN 10 02/16/2023   NA 138 02/16/2023   K 4.9 02/16/2023   CL 104 02/16/2023   CO2 25 02/16/2023   Lab Results  Component Value Date   ALT 69 (H) 02/16/2023   AST 36 02/16/2023   ALKPHOS 48 02/16/2023   BILITOT 0.6 02/16/2023   Lab Results  Component Value Date   HGBA1C 5.3 02/16/2023   HGBA1C 5.3 09/02/2022   Lab Results  Component Value Date   INSULIN 13.5 02/16/2023   Lab Results  Component Value Date  TSH 0.643 02/16/2023   Lab Results  Component Value Date   CHOL 216 (H) 02/16/2023   HDL 51 02/16/2023   LDLCALC 146 (H) 02/16/2023   TRIG 107 02/16/2023   CHOLHDL 4.2 02/16/2023   Lab Results  Component Value Date   WBC 5.1 02/16/2023   HGB 14.3 02/16/2023   HCT 43.8 02/16/2023   MCV 93 02/16/2023   PLT 273 02/16/2023   No results found for: "IRON", "TIBC", "FERRITIN"  Attestation Statements:   Reviewed by clinician on day of visit: allergies, medications, problem list, medical history, surgical history, family history, social history, and previous encounter notes.  Time spent  on visit including pre-visit chart review and post-visit charting and care was 40 minutes.   I, Malcolm Metro, am acting as Energy manager for Seymour Bars, DO.   I have reviewed the above documentation for accuracy and completeness, and I agree with the above. Glennis Brink, DO

## 2023-03-02 ENCOUNTER — Encounter: Payer: Self-pay | Admitting: Family Medicine

## 2023-03-02 ENCOUNTER — Ambulatory Visit: Payer: Managed Care, Other (non HMO) | Admitting: Family Medicine

## 2023-03-02 ENCOUNTER — Encounter: Payer: Self-pay | Admitting: *Deleted

## 2023-03-02 ENCOUNTER — Other Ambulatory Visit: Payer: Self-pay | Admitting: Family Medicine

## 2023-03-02 VITALS — BP 116/77 | HR 63 | Temp 98.3°F | Ht 72.0 in | Wt 276.0 lb

## 2023-03-02 DIAGNOSIS — E678 Other specified hyperalimentation: Secondary | ICD-10-CM | POA: Insufficient documentation

## 2023-03-02 DIAGNOSIS — R7401 Elevation of levels of liver transaminase levels: Secondary | ICD-10-CM | POA: Diagnosis not present

## 2023-03-02 DIAGNOSIS — E7800 Pure hypercholesterolemia, unspecified: Secondary | ICD-10-CM

## 2023-03-02 DIAGNOSIS — E559 Vitamin D deficiency, unspecified: Secondary | ICD-10-CM | POA: Diagnosis not present

## 2023-03-02 DIAGNOSIS — Z6837 Body mass index (BMI) 37.0-37.9, adult: Secondary | ICD-10-CM

## 2023-03-02 DIAGNOSIS — J4520 Mild intermittent asthma, uncomplicated: Secondary | ICD-10-CM

## 2023-03-02 MED ORDER — CETIRIZINE HCL 10 MG PO CHEW: 10.0000 mg | CHEWABLE_TABLET | Freq: Every day | ORAL | 1 refills | Status: DC

## 2023-03-02 MED ORDER — FLUTICASONE PROPIONATE 50 MCG/ACT NA SUSP: 1.0000 | Freq: Every day | NASAL | 5 refills | Status: AC

## 2023-03-02 MED ORDER — FLUTICASONE-SALMETEROL 100-50 MCG/ACT IN AEPB: 1.0000 | INHALATION_SPRAY | Freq: Two times a day (BID) | RESPIRATORY_TRACT | 5 refills | Status: DC

## 2023-03-02 NOTE — Telephone Encounter (Signed)
Refilled requested medications to [facility]

## 2023-03-02 NOTE — Assessment & Plan Note (Signed)
Lab Results  Component Value Date   CHOL 216 (H) 02/16/2023   HDL 51 02/16/2023   LDLCALC 146 (H) 02/16/2023   TRIG 107 02/16/2023   CHOLHDL 4.2 02/16/2023    New.  Reviewed lab results with pt He has + fam hx of HLD - mom He has never taken lipid lowering medication He has started on his prescribed meal plan that is low in saturated fat  Repeat FLP in 4-6 mos looking for improvements This pattern may be more hereditary than lifestyle related but he currently has no CV risk factors

## 2023-03-02 NOTE — Assessment & Plan Note (Signed)
Reviewed lab with pt ALT was previously high and remains elevated at 69 with no formal dx of fatty liver Denies excess intake of ETOH or Tylenol Denies abdominal pian, change in urine color or nausea  Obtain liver ultrasound to eval for hepatic steatosis Limit ETOH intake Continue active plan for weight reduction

## 2023-03-02 NOTE — Progress Notes (Signed)
Office: [phone]  /  Fax: [phone]  WEIGHT SUMMARY AND BIOMETRICS  Starting Date: 02/16/23  Starting Weight: 278lb   Weight Lost Since Last Visit: 2lb   Vitals Temp: 98.3 F (36.8 C) BP: 116/77 Pulse Rate: 63 SpO2: 99 %   Body Composition  Body Fat %: 35.8 % Fat Mass (lbs): 98 lbs Muscle Mass (lbs): 169.4 lbs Total Body Water (lbs): 123.4 lbs Visceral Fat Rating : 18    HPI  Chief Complaint: OBESITY  Darren Crawford is here to discuss his progress with his obesity treatment plan. He is on the the Category 4 Plan and states he is following his eating plan approximately 60 % of the time. He states he is walking 30 minutes 3 times per week.   Interval History:  Since last office visit he is down 2 lb He has not been as physically active due to back pain He is trying to get in all of his food but feels too full He is getting in fruits and veggies Cravings and hunger are under good control Planning out dinners can be the hardest with a busy schedule  Pharmacotherapy: none  PHYSICAL EXAM:  Blood pressure 116/77, pulse 63, temperature 98.3 F (36.8 C), height 6' (1.829 m), weight 276 lb (125.2 kg), SpO2 99%. Body mass index is 37.43 kg/m.  General: He is overweight, cooperative, alert, well developed, and in no acute distress. PSYCH: Has normal mood, affect and thought process.   Lungs: Normal breathing effort, no conversational dyspnea.   ASSESSMENT AND PLAN  TREATMENT PLAN FOR OBESITY:  Recommended Dietary Goals  Darren Crawford is currently in the action stage of change. As such, his goal is to continue weight management plan. He has agreed to the Category 4 Plan. - he can log intake on the MyFitnessPal ap with a target goal of 1800-2000 cal/ day to include 130-150 g of dietary protein  - allow up to 2 fruit servings per day - can add a high fiber carb to dinner  Behavioral Intervention  We discussed the following Behavioral Modification Strategies today:  increasing lean protein intake, decreasing simple carbohydrates , increasing vegetables, increasing lower glycemic fruits, increasing fiber rich foods, avoiding skipping meals, increasing water intake, work on meal planning and preparation, keeping healthy foods at home, continue to practice mindfulness when eating, and planning for success.  Additional resources provided today: NA  Recommended Physical Activity Goals  Darren Crawford has been advised to work up to 150 minutes of moderate intensity aerobic activity a week and strengthening exercises 2-3 times per week for cardiovascular health, weight loss maintenance and preservation of muscle mass.   He has agreed to Start aerobic activity with a goal of 150 minutes a week at moderate intensity.   Pharmacotherapy changes for the treatment of obesity: none  ASSOCIATED CONDITIONS ADDRESSED TODAY  Elevated ALT measurement -     US ABDOMEN LIMITED RUQ (LIVER/GB); Future  Morbid obesity (HCC) with starting BMI 37  BMI 37.0-37.9, adult  Vitamin D deficiency Assessment & Plan: Last vitamin D Lab Results  Component Value Date   VD25OH 30.2 02/16/2023   Reviewed lab results with patient and explain the issues with low vitamin D including fatigue, poor immune function, bone loss.  Begin OTC vitamin D 4,000 international units  daily   Excessive vitamin B12 intake Assessment & Plan: Reviewed lab results with patient.  New problem He has been taking OTC vitamin B12 daily, unsure of dose but was not previously diagnosed with B12 def  Denies feeling ill from over - supplementation  Recommend reducing OTC B12 to every other day and bring in bottle to confirm dose next visit   Hypercholesteremia Assessment & Plan: Lab Results  Component Value Date   CHOL 216 (H) 02/16/2023   HDL 51 02/16/2023   LDLCALC 146 (H) 02/16/2023   TRIG 107 02/16/2023   CHOLHDL 4.2 02/16/2023    New.  Reviewed lab results with pt He has + fam hx of HLD - mom He  has never taken lipid lowering medication He has started on his prescribed meal plan that is low in saturated fat  Repeat FLP in 4-6 mos looking for improvements This pattern may be more hereditary than lifestyle related but he currently has no CV risk factors   Elevated alanine aminotransferase (ALT) level Assessment & Plan: Reviewed lab with pt ALT was previously high and remains elevated at 69 with no formal dx of fatty liver Denies excess intake of ETOH or Tylenol Denies abdominal pian, change in urine color or nausea  Obtain liver ultrasound to eval for hepatic steatosis Limit ETOH intake Continue active plan for weight reduction       He was informed of the importance of frequent follow up visits to maximize his success with intensive lifestyle modifications for his multiple health conditions.   ATTESTASTION STATEMENTS:  Reviewed by clinician on day of visit: allergies, medications, problem list, medical history, surgical history, family history, social history, and previous encounter notes pertinent to obesity diagnosis.   I have personally spent 30 minutes total time today in preparation, patient care, nutritional counseling and documentation for this visit, including the following: review of clinical lab tests; review of medical tests/procedures/services.      Glennis Brink, DO DABFM, DABOM [facility] [address] [place], [place] [address] [phone]

## 2023-03-02 NOTE — Assessment & Plan Note (Signed)
Last vitamin D Lab Results  Component Value Date   VD25OH 30.2 02/16/2023   Reviewed lab results with patient and explain the issues with low vitamin D including fatigue, poor immune function, bone loss.  Begin OTC vitamin D 4,000 international units  daily

## 2023-03-02 NOTE — Assessment & Plan Note (Signed)
Reviewed lab results with patient.  New problem He has been taking OTC vitamin B12 daily, unsure of dose but was not previously diagnosed with B12 def Denies feeling ill from over - supplementation  Recommend reducing OTC B12 to every other day and bring in bottle to confirm dose next visit

## 2023-03-07 ENCOUNTER — Ambulatory Visit: Payer: Managed Care, Other (non HMO)

## 2023-03-07 DIAGNOSIS — R7401 Elevation of levels of liver transaminase levels: Secondary | ICD-10-CM

## 2023-03-22 ENCOUNTER — Encounter: Payer: Self-pay | Admitting: Family Medicine

## 2023-03-22 ENCOUNTER — Ambulatory Visit (INDEPENDENT_AMBULATORY_CARE_PROVIDER_SITE_OTHER): Payer: Managed Care, Other (non HMO) | Admitting: Family Medicine

## 2023-03-22 VITALS — BP 109/72 | HR 77 | Temp 98.0°F | Ht 72.0 in | Wt 277.0 lb

## 2023-03-22 DIAGNOSIS — E7800 Pure hypercholesterolemia, unspecified: Secondary | ICD-10-CM

## 2023-03-22 DIAGNOSIS — E66812 Obesity, class 2: Secondary | ICD-10-CM

## 2023-03-22 DIAGNOSIS — R7401 Elevation of levels of liver transaminase levels: Secondary | ICD-10-CM

## 2023-03-22 DIAGNOSIS — E559 Vitamin D deficiency, unspecified: Secondary | ICD-10-CM | POA: Diagnosis not present

## 2023-03-22 DIAGNOSIS — E6609 Other obesity due to excess calories: Secondary | ICD-10-CM

## 2023-03-22 DIAGNOSIS — Z6837 Body mass index (BMI) 37.0-37.9, adult: Secondary | ICD-10-CM

## 2023-03-22 NOTE — Progress Notes (Signed)
Office: [phone]  /  Fax: [phone]  WEIGHT SUMMARY AND BIOMETRICS  Starting Date: 02/16/23  Starting Weight: 278lb   Weight Lost Since Last Visit: 0lb   Vitals Temp: 98 F (36.7 C) BP: 109/72 Pulse Rate: 77 SpO2: 98 %   Body Composition  Body Fat %: 35.7 % Fat Mass (lbs): 99.2 lbs Muscle Mass (lbs): 169.8 lbs Total Body Water (lbs): 125.6 lbs Visceral Fat Rating : 18    HPI  Chief Complaint: OBESITY  Darren Crawford is here to discuss his progress with his obesity treatment plan. He is on the the Category 4 Plan and states he is following his eating plan approximately 60 % of the time. He states he is walking for  30 minutes 2 times per week.   Interval History:  Since last office visit he is up 1 lb He has a net weight loss of 1 lb in the past month He has been coaching basketball in the afternoon He has a protein shake - [employer] in the morning He has been eating eggs in the afternoon.  He is skipping carbs in the morning and afternoon. He is eating a bigger dinner and and rarely needs to snack after dinner He has been more active and energy level has improved  Pharmacotherapy: none   PHYSICAL EXAM:  Blood pressure 109/72, pulse 77, temperature 98 F (36.7 C), height 6' (1.829 m), weight 277 lb (125.6 kg), SpO2 98%. Body mass index is 37.57 kg/m.  General: He is overweight, cooperative, alert, well developed, and in no acute distress. PSYCH: Has normal mood, affect and thought process.   Lungs: Normal breathing effort, no conversational dyspnea.   ASSESSMENT AND PLAN  TREATMENT PLAN FOR OBESITY:  Recommended Dietary Goals  Bright is currently in the action stage of change. As such, his goal is to continue weight management plan. He has agreed to the Category 4 Plan. - OK to sub a protein shake for breakfast + ADD in a serving of fresh fruit in the morning - be sure to have the ONE carb serving with lunch - ADD in a high protein snack mid afternoon  like a protein bar, brands provided - reduce portion sizes at dinner, limit starches at night  Behavioral Intervention  We discussed the following Behavioral Modification Strategies today: increasing vegetables, increasing lower glycemic fruits, increasing fiber rich foods, increasing water intake , work on meal planning and preparation, keeping healthy foods at home, work on managing stress, creating time for self-care and relaxation, planning for success, and continue to work on maintaining a reduced calorie state, getting the recommended amount of protein, incorporating whole foods, making healthy choices, staying well hydrated and practicing mindfulness when eating..  Additional resources provided today: NA  Recommended Physical Activity Goals  [place] has been advised to work up to 150 minutes of moderate intensity aerobic activity a week and strengthening exercises 2-3 times per week for cardiovascular health, weight loss maintenance and preservation of muscle mass.   He has agreed to Start aerobic activity with a goal of 150 minutes a week at moderate intensity.   Pharmacotherapy changes for the treatment of obesity: none  ASSOCIATED CONDITIONS ADDRESSED TODAY  Vitamin D deficiency Assessment & Plan: Last vitamin D Lab Results  Component Value Date   VD25OH 30.2 02/16/2023   He is on OTC vitamin D 5,000 international units  daily  Repeat lab in 3 mos   Class 2 obesity due to excess calories with body mass index (BMI)  of 37.0 to 37.9 in adult, unspecified whether serious comorbidity present  Elevated ALT measurement Assessment & Plan: Lab Results  Component Value Date   ALT 69 (H) 02/16/2023   He has no confirmed diagnosis of fatty liver. He limits ETOH and tylenol use  Repeat liver enzymes in 3 mos   Hypercholesteremia Assessment & Plan: Lab Results  Component Value Date   CHOL 216 (H) 02/16/2023   HDL 51 02/16/2023   LDLCALC 146 (H) 02/16/2023   TRIG 107  02/16/2023   CHOLHDL 4.2 02/16/2023   He is working on healthy dietary changes as prescribed along with increased exercise time He has cut back on high saturated fat foods  Repeat FLP in 3 mos         He was informed of the importance of frequent follow up visits to maximize his success with intensive lifestyle modifications for his multiple health conditions.   ATTESTASTION STATEMENTS:  Reviewed by clinician on day of visit: allergies, medications, problem list, medical history, surgical history, family history, social history, and previous encounter notes pertinent to obesity diagnosis.   I have personally spent 30 minutes total time today in preparation, patient care, nutritional counseling and documentation for this visit, including the following: review of clinical lab tests; review of medical tests/procedures/services.      Glennis Brink, DO DABFM, DABOM [facility] [address] [place], [place] [address] [phone]

## 2023-03-22 NOTE — Assessment & Plan Note (Signed)
Lab Results  Component Value Date   ALT 69 (H) 02/16/2023   He has no confirmed diagnosis of fatty liver. He limits ETOH and tylenol use  Repeat liver enzymes in 3 mos

## 2023-03-22 NOTE — Assessment & Plan Note (Signed)
Lab Results  Component Value Date   CHOL 216 (H) 02/16/2023   HDL 51 02/16/2023   LDLCALC 146 (H) 02/16/2023   TRIG 107 02/16/2023   CHOLHDL 4.2 02/16/2023   He is working on healthy dietary changes as prescribed along with increased exercise time He has cut back on high saturated fat foods  Repeat FLP in 3 mos

## 2023-03-22 NOTE — Assessment & Plan Note (Signed)
Last vitamin D Lab Results  Component Value Date   VD25OH 30.2 02/16/2023   He is on OTC vitamin D 5,000 international units  daily  Repeat lab in 3 mos

## 2023-04-19 ENCOUNTER — Encounter: Payer: Self-pay | Admitting: Family Medicine

## 2023-04-19 ENCOUNTER — Ambulatory Visit (INDEPENDENT_AMBULATORY_CARE_PROVIDER_SITE_OTHER): Payer: Managed Care, Other (non HMO) | Admitting: Family Medicine

## 2023-04-19 VITALS — BP 129/79 | HR 74 | Temp 98.2°F | Ht 72.0 in | Wt 278.0 lb

## 2023-04-19 DIAGNOSIS — G4733 Obstructive sleep apnea (adult) (pediatric): Secondary | ICD-10-CM

## 2023-04-19 DIAGNOSIS — E66812 Obesity, class 2: Secondary | ICD-10-CM

## 2023-04-19 DIAGNOSIS — Z6837 Body mass index (BMI) 37.0-37.9, adult: Secondary | ICD-10-CM | POA: Diagnosis not present

## 2023-04-19 DIAGNOSIS — R632 Polyphagia: Secondary | ICD-10-CM | POA: Diagnosis not present

## 2023-04-19 MED ORDER — LOMAIRA 8 MG PO TABS
ORAL_TABLET | ORAL | 0 refills | Status: DC
Start: 2023-04-19 — End: 2023-05-22

## 2023-04-19 NOTE — Assessment & Plan Note (Signed)
Hunger better controlled with eating on a schedule, lean protein and fiber with meals. Since he is making an effort over the past 2 mos but has a net weight loss of 0 lbs, will initiate Lomaira 8 mg tab dosed 30 min before lunch daily.  He lacks insurance coverage for AOMs.

## 2023-04-19 NOTE — Progress Notes (Signed)
Office: [phone]  /  Fax: [phone]  WEIGHT SUMMARY AND BIOMETRICS  Starting Date: 02/16/23  Starting Weight: 278lb   Weight Lost Since Last Visit: 0lb   Vitals Temp: 98.2 F (36.8 C) BP: 129/79 Pulse Rate: 74 SpO2: 97 %   Body Composition  Body Fat %: 34.9 % Fat Mass (lbs): 97 lbs Muscle Mass (lbs): 172.2 lbs Total Body Water (lbs): 123.6 lbs Visceral Fat Rating : 18    HPI  Chief Complaint: OBESITY  Darren Crawford is here to discuss his progress with his obesity treatment plan. He is on the the Category 4 Plan and states he is following his eating plan approximately 75 % of the time. He states he is walking a little each day.     Interval History:  Since last office visit he is up 1 lb He is making healthy food choices He is frustrated by his lack of weight loss over 2 mos He is getting used to CPAP, getting 7- 8 hrs of sleep at night He is more active coaching basketball He has added a protein shake and fruit for breakfast  He is up 2.4 lb of muscle mass and down 2.2 lb of body fat in the past month He has a net weight loss of 0 lb in 2 mos  Pharmacotherapy: none  PHYSICAL EXAM:  Blood pressure 129/79, pulse 74, temperature 98.2 F (36.8 C), height 6' (1.829 m), weight 278 lb (126.1 kg), SpO2 97%. Body mass index is 37.7 kg/m.  General: He is overweight, cooperative, alert, well developed, and in no acute distress. PSYCH: Has normal mood, affect and thought process.   Lungs: Normal breathing effort, no conversational dyspnea.   ASSESSMENT AND PLAN  TREATMENT PLAN FOR OBESITY:  Recommended Dietary Goals  Darren Crawford is currently in the action stage of change. As such, his goal is to continue weight management plan. He has agreed to the Category 4 Plan.  Behavioral Intervention  We discussed the following Behavioral Modification Strategies today: increasing lean protein intake to established goals, increasing vegetables, increasing fiber rich foods,  increasing water intake , work on meal planning and preparation, keeping healthy foods at home, work on managing stress, creating time for self-care and relaxation, avoiding temptations and identifying enticing environmental cues, planning for success, and continue to work on maintaining a reduced calorie state, getting the recommended amount of protein, incorporating whole foods, making healthy choices, staying well hydrated and practicing mindfulness when eating..  Additional resources provided today: NA  Recommended Physical Activity Goals  Darren Crawford has been advised to work up to 150 minutes of moderate intensity aerobic activity a week and strengthening exercises 2-3 times per week for cardiovascular health, weight loss maintenance and preservation of muscle mass.   He has agreed to Start aerobic activity with a goal of 150 minutes a week at moderate intensity.   Pharmacotherapy changes for the treatment of obesity: begin Lomaira 8 mg tab 30 min before lunch daily; PDMP reviewed.  BP/ HR are WNL.  Informed consent signed.  Will use with a reduced kcal high protein diet with regular exercise.  Reviewed MOA and potential adverse SE  ASSOCIATED CONDITIONS ADDRESSED TODAY  Polyphagia Assessment & Plan: Hunger better controlled with eating on a schedule, lean protein and fiber with meals. Since he is making an effort over the past 2 mos but has a net weight loss of 0 lbs, will initiate Lomaira 8 mg tab dosed 30 min before lunch daily.  He lacks insurance coverage  for AOMs.    Orders: -     Lomaira; 1 tab po 30 min before lunch daily  Dispense: 30 tablet; Refill: 0  Class 2 obesity due to excess calories with body mass index (BMI) of 37.0 to 37.9 in adult, unspecified whether serious comorbidity present  Obstructive sleep apnea syndrome Assessment & Plan: He started on CPAP and averages 7-8 hrs of sleep at night.  Energy level is improving.   Look for improvements in his metabolic rate which  was >400 cal/ day slower than expected likely due to untreated OSA.       He was informed of the importance of frequent follow up visits to maximize his success with intensive lifestyle modifications for his multiple health conditions.   ATTESTASTION STATEMENTS:  Reviewed by clinician on day of visit: allergies, medications, problem list, medical history, surgical history, family history, social history, and previous encounter notes pertinent to obesity diagnosis.   I have personally spent 30 minutes total time today in preparation, patient care, nutritional counseling and documentation for this visit, including the following: review of clinical lab tests; review of medical tests/procedures/services.      Glennis Brink, DO DABFM, DABOM [facility] [address] [place], [place] [address] [phone]

## 2023-04-19 NOTE — Assessment & Plan Note (Signed)
He started on CPAP and averages 7-8 hrs of sleep at night.  Energy level is improving.   Look for improvements in his metabolic rate which was >400 cal/ day slower than expected likely due to untreated OSA.

## 2023-05-09 ENCOUNTER — Encounter: Payer: Self-pay | Admitting: Family Medicine

## 2023-05-22 ENCOUNTER — Encounter: Payer: Self-pay | Admitting: Family Medicine

## 2023-05-22 ENCOUNTER — Ambulatory Visit (INDEPENDENT_AMBULATORY_CARE_PROVIDER_SITE_OTHER): Payer: Managed Care, Other (non HMO) | Admitting: Family Medicine

## 2023-05-22 VITALS — BP 124/79 | HR 63 | Ht 72.0 in | Wt 277.0 lb

## 2023-05-22 DIAGNOSIS — E7800 Pure hypercholesterolemia, unspecified: Secondary | ICD-10-CM

## 2023-05-22 DIAGNOSIS — E66812 Obesity, class 2: Secondary | ICD-10-CM

## 2023-05-22 DIAGNOSIS — R7401 Elevation of levels of liver transaminase levels: Secondary | ICD-10-CM | POA: Diagnosis not present

## 2023-05-22 DIAGNOSIS — R632 Polyphagia: Secondary | ICD-10-CM

## 2023-05-22 DIAGNOSIS — E559 Vitamin D deficiency, unspecified: Secondary | ICD-10-CM

## 2023-05-22 DIAGNOSIS — Z6837 Body mass index (BMI) 37.0-37.9, adult: Secondary | ICD-10-CM

## 2023-05-22 MED ORDER — LOMAIRA 8 MG PO TABS
ORAL_TABLET | ORAL | 0 refills | Status: AC
Start: 2023-05-22 — End: ?

## 2023-05-22 NOTE — Assessment & Plan Note (Signed)
Last vitamin D Lab Results  Component Value Date   VD25OH 30.2 02/16/2023   Taking OTC vitamin D 5,000 international units  daily Energy level improving  Repeat vitamin D level next visit

## 2023-05-22 NOTE — Assessment & Plan Note (Signed)
Improved some on Lomaira dosed once daily before lunch but challenges included remembering to take it, travel, holidays.  Denies adverse side effects. BP/ HR are WNL.  He will continue to work on adherence to cat 4 meal plan with a focus on lean protein + fiber with meals Increase lomaira to bid, taking 1 tab 30 min before breakfast and 1 tab at 2 pm daily

## 2023-05-22 NOTE — Progress Notes (Signed)
Office: [phone]  /  Fax: [phone]  WEIGHT SUMMARY AND BIOMETRICS  Starting Date: 02/16/23  Starting Weight: 278lb   Weight Lost Since Last Visit: 1lb   Vitals BP: 124/79 Pulse Rate: 63 SpO2: 97 %   Body Composition  Body Fat %: 35.9 % Fat Mass (lbs): 99.8 lbs Muscle Mass (lbs): 169.2 lbs Total Body Water (lbs): 125 lbs Visceral Fat Rating : 18    HPI  Chief Complaint: OBESITY  Darren Crawford is here to discuss his progress with his obesity treatment plan. He is on the the Category 4 Plan and states he is following his eating plan approximately 80 % of the time. He states he is exercising 30 minutes 3 times per week.   Interval History:  Since last office visit he is down 1 lb This gives him a net weight loss of just 1 lb in 3 mos He traveled with his family for Thanksgiving and then went to [facility] He brought in food into the park and ate out once a day He denies sugar craving He did start Lomaira 8 mg tab once a a day with some improvements in control over portion sizes and appetite.  He is forgetting it some days, dosed 1 hr before lunch Exercise has been limited due to lower back pain with radiation into the L leg   Pharmacotherapy: Lomaira 8 mg once daily  PHYSICAL EXAM:  Blood pressure 124/79, pulse 63, height 6' (1.829 m), weight 277 lb (125.6 kg), SpO2 97%. Body mass index is 37.57 kg/m.  General: He is overweight, cooperative, alert, well developed, and in no acute distress. PSYCH: Has normal mood, affect and thought process.   Lungs: Normal breathing effort, no conversational dyspnea.   ASSESSMENT AND PLAN  TREATMENT PLAN FOR OBESITY:  Recommended Dietary Goals  Darren Crawford is currently in the action stage of change. As such, his goal is to continue weight management plan. He has agreed to the Category 4 Plan.  Behavioral Intervention  We discussed the following Behavioral Modification Strategies today: increasing lean protein intake to  established goals, decreasing simple carbohydrates , increasing fiber rich foods, increasing water intake , work on meal planning and preparation, practice mindfulness eating and understand the difference between hunger signals and cravings, continue to work on implementation of reduced calorie nutritional plan, planning for success, and continue to work on maintaining a reduced calorie state, getting the recommended amount of protein, incorporating whole foods, making healthy choices, staying well hydrated and practicing mindfulness when eating..  Additional resources provided today: NA  Recommended Physical Activity Goals  Darren Crawford has been advised to work up to 150 minutes of moderate intensity aerobic activity a week and strengthening exercises 2-3 times per week for cardiovascular health, weight loss maintenance and preservation of muscle mass.   He has agreed to Think about enjoyable ways to increase daily physical activity and overcoming barriers to exercise and Increase physical activity in their day and reduce sedentary time (increase NEAT). - plan to ramp up exercise once back pain has improved  Pharmacotherapy changes for the treatment of obesity: increase Lomaira to 8 mg bid  ASSOCIATED CONDITIONS ADDRESSED TODAY  Polyphagia Assessment & Plan: Improved some on Lomaira dosed once daily before lunch but challenges included remembering to take it, travel, holidays.  Denies adverse side effects. BP/ HR are WNL.  He will continue to work on adherence to cat 4 meal plan with a focus on lean protein + fiber with meals Increase lomaira to bid, taking  1 tab 30 min before breakfast and 1 tab at 2 pm daily   Class 2 obesity due to excess calories with body mass index (BMI) of 37.0 to 37.9 in adult, unspecified whether serious comorbidity present -     Lomaira; 1 tab po 30 min before breakfast and 1 tab po at 2 pm  Dispense: 28 tablet; Refill: 0  Elevated ALT measurement Assessment &  Plan: Lab Results  Component Value Date   ALT 69 (H) 02/16/2023   No previous dx of hepatic steatosis Denies heavy intake of ETOH or Tylenol Actively working on weight reduction  Repeat LFTs next visit   Vitamin D deficiency Assessment & Plan: Last vitamin D Lab Results  Component Value Date   VD25OH 30.2 02/16/2023   Taking OTC vitamin D 5,000 international units  daily Energy level improving  Repeat vitamin D level next visit   Hypercholesteremia Assessment & Plan: Lab Results  Component Value Date   CHOL 216 (H) 02/16/2023   HDL 51 02/16/2023   LDLCALC 146 (H) 02/16/2023   TRIG 107 02/16/2023   CHOLHDL 4.2 02/16/2023   Not on any lipid lowering medications Pt has been working on a low saturated fat diet with more regular physical activity  Repeat FLP next visit       He was informed of the importance of frequent follow up visits to maximize his success with intensive lifestyle modifications for his multiple health conditions.   ATTESTASTION STATEMENTS:  Reviewed by clinician on day of visit: allergies, medications, problem list, medical history, surgical history, family history, social history, and previous encounter notes pertinent to obesity diagnosis.   I have personally spent 30 minutes total time today in preparation, patient care, nutritional counseling and documentation for this visit, including the following: review of clinical lab tests; review of medical tests/procedures/services.      Glennis Brink, DO DABFM, DABOM [facility] [address] [place], [place] [address] [phone]

## 2023-05-22 NOTE — Assessment & Plan Note (Signed)
Lab Results  Component Value Date   CHOL 216 (H) 02/16/2023   HDL 51 02/16/2023   LDLCALC 146 (H) 02/16/2023   TRIG 107 02/16/2023   CHOLHDL 4.2 02/16/2023   Not on any lipid lowering medications Pt has been working on a low saturated fat diet with more regular physical activity  Repeat FLP next visit

## 2023-05-22 NOTE — Assessment & Plan Note (Signed)
Lab Results  Component Value Date   ALT 69 (H) 02/16/2023   No previous dx of hepatic steatosis Denies heavy intake of ETOH or Tylenol Actively working on weight reduction  Repeat LFTs next visit

## 2023-05-23 ENCOUNTER — Encounter: Payer: Self-pay | Admitting: Family Medicine

## 2023-05-23 ENCOUNTER — Ambulatory Visit (INDEPENDENT_AMBULATORY_CARE_PROVIDER_SITE_OTHER): Payer: Managed Care, Other (non HMO) | Admitting: Family Medicine

## 2023-05-23 ENCOUNTER — Ambulatory Visit: Payer: Managed Care, Other (non HMO)

## 2023-05-23 VITALS — BP 119/72 | HR 64 | Temp 97.8°F | Resp 18 | Ht 72.0 in | Wt 281.0 lb

## 2023-05-23 DIAGNOSIS — M51372 Other intervertebral disc degeneration, lumbosacral region with discogenic back pain and lower extremity pain: Secondary | ICD-10-CM | POA: Diagnosis not present

## 2023-05-23 MED ORDER — AMITRIPTYLINE HCL 25 MG PO TABS
25.0000 mg | ORAL_TABLET | Freq: Every day | ORAL | 0 refills | Status: DC
Start: 2023-05-23 — End: 2023-06-15

## 2023-05-23 NOTE — Progress Notes (Signed)
Established Patient Office Visit  Subjective   Patient ID: Darren Crawford, male    DOB: 06-01-1986  Age: 37 y.o. MRN: [id number]  Chief Complaint  Patient presents with   Back Pain    Patient states that he has always had back issues for the past 10years, but he states, that recently within the last 2 months he states while laying down his whole left leg will go numb, and the pain is across his lower back.     Back Pain Associated symptoms include tingling.   Back pain Pt reports hx of back issues for many years. He reports for the last 2 months, he's had worsening numbness from his left hip down to his left shin when laying down on his back or on his left side at night. He reports it's constant numbness when he is in this position. When he lays on his right side, it improves the numbness but he is unable to due to hx of right shoulder surgery. He has tried nothing for the symptoms other than changing his positions.    Review of Systems  Musculoskeletal:  Positive for back pain.  Neurological:  Positive for tingling and sensory change.  All other systems reviewed and are negative.    Objective:     BP 119/72   Pulse 64   Temp 97.8 F (36.6 C) (Oral)   Resp 18   Ht 6' (1.829 m)   Wt 281 lb (127.5 kg)   SpO2 98%   BMI 38.11 kg/m  BP Readings from Last 3 Encounters:  05/23/23 119/72  05/22/23 124/79  04/19/23 129/79      Physical Exam Vitals and nursing note reviewed.  Constitutional:      Appearance: Normal appearance. He is normal weight.  HENT:     Head: Normocephalic and atraumatic.     Right Ear: Tympanic membrane and external ear normal.     Left Ear: External ear normal.     Nose: Nose normal.     Mouth/Throat:     Mouth: Mucous membranes are moist.     Pharynx: Oropharynx is clear.  Eyes:     Conjunctiva/sclera: Conjunctivae normal.     Pupils: Pupils are equal, round, and reactive to light.  Cardiovascular:     Rate and Rhythm: Normal rate.   Pulmonary:     Effort: Pulmonary effort is normal.  Musculoskeletal:        General: Normal range of motion.  Skin:    General: Skin is warm.     Capillary Refill: Capillary refill takes less than 2 seconds.  Neurological:     General: No focal deficit present.     Mental Status: He is alert and oriented to person, place, and time. Mental status is at baseline.  Psychiatric:        Mood and Affect: Mood normal.        Behavior: Behavior normal.        Thought Content: Thought content normal.        Judgment: Judgment normal.    No results found for any visits on 05/23/23.     The ASCVD Risk score (Arnett DK, et al., 2019) failed to calculate for the following reasons:   The 2019 ASCVD risk score is only valid for ages 30 to 65    Assessment & Plan:   Problem List Items Addressed This Visit   None Degeneration of intervertebral disc of lumbosacral region with discogenic back pain and lower extremity  pain -     Amitriptyline HCl; Take 1 tablet (25 mg total) by mouth at bedtime.  Dispense: 30 tablet; Refill: 0 -     DG Lumbar Spine Complete; Future -     Ambulatory referral to Physical Therapy   Symptoms most consistent with 'pinch nerve' causing sciatica symptoms with  numbness down left leg.  Send for x-rays Advised to do OTC aleve, Lidoderm pain patches, heating pads, and will add Elavil 25 mg to use at night.  Send to PT    No follow-ups on file.    Suzan Slick, MD

## 2023-06-14 ENCOUNTER — Other Ambulatory Visit: Payer: Self-pay | Admitting: Family Medicine

## 2023-06-14 DIAGNOSIS — M51372 Other intervertebral disc degeneration, lumbosacral region with discogenic back pain and lower extremity pain: Secondary | ICD-10-CM

## 2023-06-21 ENCOUNTER — Ambulatory Visit: Payer: Managed Care, Other (non HMO) | Admitting: Family Medicine

## 2023-09-04 ENCOUNTER — Encounter: Payer: Self-pay | Admitting: Family Medicine

## 2023-09-04 ENCOUNTER — Ambulatory Visit (INDEPENDENT_AMBULATORY_CARE_PROVIDER_SITE_OTHER): Payer: Managed Care, Other (non HMO) | Admitting: Family Medicine

## 2023-09-04 VITALS — BP 111/78 | HR 60 | Temp 98.0°F | Resp 18 | Ht 72.0 in | Wt 279.8 lb

## 2023-09-04 DIAGNOSIS — Z136 Encounter for screening for cardiovascular disorders: Secondary | ICD-10-CM | POA: Diagnosis not present

## 2023-09-04 DIAGNOSIS — Z0000 Encounter for general adult medical examination without abnormal findings: Secondary | ICD-10-CM

## 2023-09-04 DIAGNOSIS — Z13220 Encounter for screening for lipoid disorders: Secondary | ICD-10-CM | POA: Diagnosis not present

## 2023-09-04 NOTE — Progress Notes (Signed)
 Complete physical exam  Patient: Darren Crawford   DOB: [birth year]   38 y.o. Male  MRN: [id number]  Subjective:    Chief Complaint  Patient presents with   Annual Exam    Darren Crawford is a 38 y.o. male who presents today for a complete physical exam. He reports consuming a  high protein  diet.  Unable to due to shoulder surgery, walking  He generally feels fairly well. He reports sleeping fairly well. He does not have additional problems to discuss today.  Pt declines vaccines   Most recent fall risk assessment:    05/17/2022   10:27 AM  Fall Risk   Falls in the past year? 0  Number falls in past yr: 0  Injury with Fall? 1     Most recent depression screenings:    05/17/2022   10:20 AM  PHQ 2/9 Scores  PHQ - 2 Score 1  PHQ- 9 Score 11    Vision:Within last year  Patient Active Problem List   Diagnosis Date Noted   Polyphagia 04/19/2023   Vitamin D deficiency 03/02/2023   Excessive vitamin B12 intake 03/02/2023   Hypercholesteremia 03/02/2023   Other fatigue 02/16/2023   SOBOE (shortness of breath on exertion) 02/16/2023   Central adiposity 02/16/2023   OSA (obstructive sleep apnea) 02/16/2023   Lumbar disc disease 02/16/2023   Gastroesophageal reflux disease 02/16/2023   Depression 02/16/2023   Depression screen 02/16/2023   BMI 37.0-37.9, adult 02/16/2023   Generalized obesity 02/16/2023   Elevated ALT measurement 02/07/2023   Labral tear of shoulder, right, initial encounter 12/13/2017   Chronic allergic rhinitis 08/03/2016   Morbid obesity (HCC) with starting BMI 37 08/03/2016   Obstructive sleep apnea syndrome 08/03/2016   Past Medical History:  Diagnosis Date   Anxiety    Asthma    Back pain    Chest pain    Chronic allergic rhinitis 08/03/2016   Chronic right shoulder pain    Constipation    GERD (gastroesophageal reflux disease)    IBS (irritable bowel syndrome)    Lactose intolerance    Morbid obesity (HCC) 08/03/2016   Multiple food  allergies    Obstructive sleep apnea syndrome 08/03/2016   Palpitations    SOB (shortness of breath)    Past Surgical History:  Procedure Laterality Date   HERNIA REPAIR     SHOULDER ARTHROSCOPY WITH DISTAL CLAVICLE RESECTION Right 11/03/2022   Procedure: SHOULDER ARTHROSCOPY WITH DISTAL CLAVICLE EXCISION;  Surgeon: Bjorn Pippin, MD;  Location: [facility];  Service: Orthopedics;  Laterality: Right;   SHOULDER ARTHROSCOPY WITH SUBACROMIAL DECOMPRESSION AND BICEP TENDON REPAIR Right 11/03/2022   Procedure: SHOULDER ARTHROSCOPY WITH SUBACROMIAL DECOMPRESSION AND BICEP TENDON REPAIR;  Surgeon: Bjorn Pippin, MD;  Location: [facility];  Service: Orthopedics;  Laterality: Right;   TOE SURGERY     Social History   Tobacco Use   Smoking status: Never   Smokeless tobacco: Never  Vaping Use   Vaping status: Never Used  Substance Use Topics   Alcohol use: Never   Drug use: Never   Family Status  Relation Name Status   Mother  Alive   Father  Alive   Sister  Alive  No partnership data on file   Allergies  Allergen Reactions   Ibuprofen Rash   Penicillins Rash   Prednisone Anxiety      Patient Care Team: Suzan Slick, MD as PCP - General (Family Medicine) Monica Becton,  MD as Consulting Physician (Sports Medicine)   Outpatient Medications Prior to Visit  Medication Sig   albuterol (VENTOLIN HFA) 108 (90 Base) MCG/ACT inhaler Inhale into the lungs every 6 (six) hours as needed for wheezing or shortness of breath.   amitriptyline (ELAVIL) 25 MG tablet TAKE 1 TABLET BY MOUTH EVERYDAY AT BEDTIME   azelastine (OPTIVAR) 0.05 % ophthalmic solution Apply 1 drop to eye 2 (two) times daily.   cetirizine (ZYRTEC) 10 MG chewable tablet Chew 1 tablet (10 mg total) by mouth daily.   fluticasone (FLONASE) 50 MCG/ACT nasal spray Place 1 spray into both nostrils daily.   Misc. Devices MISC Start AutoCPAP at 5-15 cm. water pressure.  Prefer Resmed S11  AutoCPAP machine with a mask, supplies and heated tubing for moderate OSA with AHI 28. Please do mask fitting and use a mask of patient preference.  Send to a local DME.   Phentermine HCl (LOMAIRA) 8 MG TABS 1 tab po 30 min before breakfast and 1 tab po at 2 pm   fluticasone-salmeterol (ADVAIR) 100-50 MCG/ACT AEPB Inhale 1 puff into the lungs 2 (two) times daily.   Facility-Administered Medications Prior to Visit  Medication Dose Route Frequency Provider   triamcinolone acetonide (KENALOG-40) injection 40 mg  40 mg Intramuscular Once Monica Becton, MD    Review of Systems  All other systems reviewed and are negative.        Objective:     BP 111/78   Pulse 60   Temp 98 F (36.7 C) (Oral)   Resp 18   Ht 6' (1.829 m)   Wt 279 lb 12.8 oz (126.9 kg)   SpO2 99%   BMI 37.95 kg/m  BP Readings from Last 3 Encounters:  09/04/23 111/78  05/23/23 119/72  05/22/23 124/79      Physical Exam Vitals and nursing note reviewed.  Constitutional:      Appearance: Normal appearance. He is obese.  HENT:     Head: Normocephalic and atraumatic.     Right Ear: Tympanic membrane, ear canal and external ear normal.     Left Ear: Tympanic membrane, ear canal and external ear normal.     Nose: Nose normal.     Mouth/Throat:     Mouth: Mucous membranes are moist.     Pharynx: Oropharynx is clear.  Eyes:     Conjunctiva/sclera: Conjunctivae normal.     Pupils: Pupils are equal, round, and reactive to light.  Cardiovascular:     Rate and Rhythm: Normal rate and regular rhythm.     Pulses: Normal pulses.     Heart sounds: Normal heart sounds.  Pulmonary:     Effort: Pulmonary effort is normal.     Breath sounds: Normal breath sounds.  Abdominal:     General: Abdomen is flat. Bowel sounds are normal.  Skin:    General: Skin is warm.     Capillary Refill: Capillary refill takes less than 2 seconds.  Neurological:     General: No focal deficit present.     Mental Status: He is  alert and oriented to person, place, and time. Mental status is at baseline.  Psychiatric:        Mood and Affect: Mood normal.        Behavior: Behavior normal.        Thought Content: Thought content normal.        Judgment: Judgment normal.     No results found for any visits on 09/04/23. Last  CBC Lab Results  Component Value Date   WBC 5.1 02/16/2023   HGB 14.3 02/16/2023   HCT 43.8 02/16/2023   MCV 93 02/16/2023   MCH 30.5 02/16/2023   RDW 12.5 02/16/2023   PLT 273 02/16/2023   Last metabolic panel Lab Results  Component Value Date   GLUCOSE 88 02/16/2023   NA 138 02/16/2023   K 4.9 02/16/2023   CL 104 02/16/2023   CO2 25 02/16/2023   BUN 10 02/16/2023   CREATININE 0.87 02/16/2023   EGFR 113 02/16/2023   CALCIUM 9.6 02/16/2023   PROT 7.0 02/16/2023   ALBUMIN 4.1 02/16/2023   LABGLOB 2.9 02/16/2023   AGRATIO 1.4 09/02/2022   BILITOT 0.6 02/16/2023   ALKPHOS 48 02/16/2023   AST 36 02/16/2023   ALT 69 (H) 02/16/2023   Last lipids Lab Results  Component Value Date   CHOL 216 (H) 02/16/2023   HDL 51 02/16/2023   LDLCALC 146 (H) 02/16/2023   TRIG 107 02/16/2023   CHOLHDL 4.2 02/16/2023   Last hemoglobin A1c Lab Results  Component Value Date   HGBA1C 5.3 02/16/2023        Assessment & Plan:    Routine Health Maintenance and Physical Exam   There is no immunization history on file for this patient.  Health Maintenance  Topic Date Due   Pneumococcal Vaccine 22-15 Years old (1 of 2 - PCV) Never done   INFLUENZA VACCINE  Never done   COVID-19 Vaccine (1 - 2024-25 season) Never done   Hepatitis C Screening  Completed   HIV Screening  Completed   HPV VACCINES  Aged Out   DTaP/Tdap/Td  Discontinued    Discussed health benefits of physical activity, and encouraged him to engage in regular exercise appropriate for his age and condition.  Problem List Items Addressed This Visit   None  No follow-ups on file. Annual physical exam  Encounter for  lipid screening for cardiovascular disease -     Lipid panel   Cholesterol done today. Pt had labs done in Sept 2024 reviewed and normal including CBC, CMP, and A1c.    Suzan Slick, MD

## 2023-09-05 ENCOUNTER — Encounter: Payer: Self-pay | Admitting: Family Medicine

## 2023-09-05 LAB — LIPID PANEL
Chol/HDL Ratio: 3.7 ratio (ref 0.0–5.0)
Cholesterol, Total: 179 mg/dL (ref 100–199)
HDL: 49 mg/dL
LDL Chol Calc (NIH): 101 mg/dL — ABNORMAL HIGH (ref 0–99)
Triglycerides: 165 mg/dL — ABNORMAL HIGH (ref 0–149)
VLDL Cholesterol Cal: 29 mg/dL (ref 5–40)

## 2023-10-04 ENCOUNTER — Ambulatory Visit: Admitting: Family Medicine

## 2024-01-08 ENCOUNTER — Encounter: Payer: Self-pay | Admitting: Family Medicine

## 2024-02-13 ENCOUNTER — Encounter: Payer: Self-pay | Admitting: Sports Medicine

## 2024-04-01 LAB — LAB REPORT - SCANNED
A1c: 5.2
EGFR: 119

## 2024-05-22 ENCOUNTER — Ambulatory Visit: Payer: Self-pay

## 2024-05-22 NOTE — Telephone Encounter (Signed)
 FYI Only or Action Required?: FYI only for provider: appointment scheduled on 05/24/2024.  Patient was last seen in primary care on 09/04/2023 by Colette Torrence GRADE, MD.  Called Nurse Triage reporting Pain.  Symptoms began ongoing and worsening last couple weeks.  Interventions attempted: Nothing.  Symptoms are: gradually worsening.  Triage Disposition: See PCP When Office is Open (Within 3 Days)  Patient/caregiver understands and will follow disposition?:    Copied from CRM #[id number]. Topic: Clinical - Red Word Triage >> May 22, 2024  4:41 PM Mercer PEDLAR wrote: Red Word that prompted transfer to Nurse Triage: Swollen left knee and sharp stabbing pain on side of knee. Reason for Disposition  [1] MODERATE pain (e.g., interferes with normal activities, limping) AND [2] present > 3 days  Answer Assessment - Initial Assessment Questions 1. ONSET: When did the pain start?      [place] in June Worsening in September and worsening again x 2 weeks 2. LOCATION: Where is the pain located?      Left knee 3. PAIN: How bad is the pain?    (Scale 1-10; or mild, moderate, severe)     9/10 stabbing  comes and goes 4. WORK OR EXERCISE: Has there been any recent work or exercise that involved this part of the body?      na 5. CAUSE: What do you think is causing the leg pain?     fell 6. OTHER SYMPTOMS: Do you have any other symptoms? (e.g., chest pain, back pain, breathing difficulty, swelling, rash, fever, numbness, weakness)     Mid-Swelling all the times- swelling dull pain 2/10 7. PREGNANCY: Is there any chance you are pregnant? When was your last menstrual period?     No  Injury knee in June-slipped and fell onto wood floor and has been having issues since then  Protocols used: Leg Pain-A-AH

## 2024-05-24 ENCOUNTER — Ambulatory Visit: Admitting: Family Medicine

## 2024-06-14 ENCOUNTER — Ambulatory Visit

## 2024-06-14 ENCOUNTER — Ambulatory Visit: Payer: Self-pay | Admitting: Family Medicine

## 2024-06-14 ENCOUNTER — Ambulatory Visit: Admitting: Family Medicine

## 2024-06-14 ENCOUNTER — Encounter: Payer: Self-pay | Admitting: Family Medicine

## 2024-06-14 VITALS — BP 108/70 | HR 67 | Temp 97.9°F | Ht 72.0 in | Wt 281.0 lb

## 2024-06-14 DIAGNOSIS — M25562 Pain in left knee: Secondary | ICD-10-CM | POA: Diagnosis not present

## 2024-06-14 DIAGNOSIS — J4520 Mild intermittent asthma, uncomplicated: Secondary | ICD-10-CM

## 2024-06-14 MED ORDER — ALBUTEROL SULFATE HFA 108 (90 BASE) MCG/ACT IN AERS: 1.0000 | INHALATION_SPRAY | Freq: Four times a day (QID) | RESPIRATORY_TRACT | 1 refills | Status: AC | PRN

## 2024-06-14 MED ORDER — FLUTICASONE-SALMETEROL 100-50 MCG/ACT IN AEPB: 1.0000 | INHALATION_SPRAY | Freq: Two times a day (BID) | RESPIRATORY_TRACT | 5 refills | Status: AC

## 2024-06-14 MED ORDER — CETIRIZINE HCL 10 MG PO CHEW: 10.0000 mg | CHEWABLE_TABLET | Freq: Every day | ORAL | 1 refills | Status: AC

## 2024-06-14 NOTE — Assessment & Plan Note (Signed)
 Refills sent today. Follow-up with PCP if symptoms worsen.

## 2024-06-14 NOTE — Progress Notes (Signed)
 "  Acute Office Visit  Subjective:     Patient ID: Darren Crawford, male    DOB: Aug 30, 1985, 39 y.o.   MRN: [id number]  Chief Complaint  Patient presents with   Knee Pain    Left. Had a fall in June. Having more swelling and sharp pains since October.    HPI Patient is in today for knee pain, slipped and landed onto left knee. Did not seek care at that time. Reports swelling with walking after 5 minutes. Lateral movements cause lateral left knee pain. ROM is decreased. Feels like there is something in the knee that prevents movement. Naproxen helps in the moment. Tenderness on lateral left knee.  Has meloxicam  at home.  Send for x-ray today. Referral placed to ortho for evaluation, may need advanced imaging.   Obesity: Has seen [facility] in the past. Interested in GLP-1 therapy. Has sleep apnea.  Wants to re-establish with [facility] soon for weight management.   Review of Systems  Psychiatric/Behavioral:  Negative for suicidal ideas.         Objective:    BP 108/70 (BP Location: Left Arm, Patient Position: Sitting, Cuff Size: Large)   Pulse 67   Temp 97.9 F (36.6 C) (Oral)   Ht 6' (1.829 m)   Wt 281 lb (127.5 kg)   SpO2 97%   BMI 38.11 kg/m    Physical Exam Vitals and nursing note reviewed.  Constitutional:      General: He is not in acute distress.    Appearance: Normal appearance.  Pulmonary:     Effort: Pulmonary effort is normal.  Musculoskeletal:     Left knee: No swelling. Decreased range of motion. Tenderness present.  Skin:    General: Skin is warm and dry.  Neurological:     General: No focal deficit present.     Mental Status: He is alert. Mental status is at baseline.  Psychiatric:        Mood and Affect: Mood normal.        Behavior: Behavior normal.        Thought Content: Thought content normal.        Judgment: Judgment normal.     No results found for any visits on 06/14/24.      Assessment & Plan:   Problem List Items Addressed This  Visit     Mild intermittent asthma without complication   Refills sent today. Follow-up with PCP if symptoms worsen.       Relevant Medications   albuterol (VENTOLIN HFA) 108 (90 Base) MCG/ACT inhaler   cetirizine  (ZYRTEC ) 10 MG chewable tablet   fluticasone -salmeterol (ADVAIR) 100-50 MCG/ACT AEPB   Acute pain of left knee - Primary   Patient is in today for knee pain, slipped and landed onto left knee in June.  Did not seek care at that time. Reports swelling with walking after 5 minutes. Lateral movements cause lateral left knee pain. ROM is decreased. Feels like there is something in the knee that prevents movement. Naproxen helps in the moment. Tenderness on lateral left knee.  Has meloxicam  at home.  Send for x-ray today. Referral placed to ortho for evaluation, may need advanced imaging.        Relevant Orders   DG Knee Complete 4 Views Left (Completed)   Ambulatory referral to Orthopedics    Meds ordered this encounter  Medications   albuterol (VENTOLIN HFA) 108 (90 Base) MCG/ACT inhaler    Sig: Inhale 1 puff into the lungs every  6 (six) hours as needed for wheezing or shortness of breath.    Dispense:  6.7 g    Refill:  1   cetirizine  (ZYRTEC ) 10 MG chewable tablet    Sig: Chew 1 tablet (10 mg total) by mouth daily.    Dispense:  90 tablet    Refill:  1    Supervising Provider:   METHENEY, CATHERINE D [[id number]]   fluticasone -salmeterol (ADVAIR) 100-50 MCG/ACT AEPB    Sig: Inhale 1 puff into the lungs 2 (two) times daily.    Dispense:  60 each    Refill:  5    Supervising Provider:   METHENEY, CATHERINE D [[id number]]  Agrees with plan of care discussed.  Questions answered.   Return if symptoms worsen or fail to improve.  Darice JONELLE Brownie, FNP   "

## 2024-06-14 NOTE — Patient Instructions (Signed)
 [facility]  [address]  The radiology department is on the first floor which is best accessed by going around to the back of the building. No appointment necessary. You can go at your convenience.

## 2024-06-14 NOTE — Assessment & Plan Note (Addendum)
 Patient is in today for knee pain, slipped and landed onto left knee in June.  Did not seek care at that time. Reports swelling with walking after 5 minutes. Lateral movements cause lateral left knee pain. ROM is decreased. Feels like there is something in the knee that prevents movement. Naproxen helps in the moment. Tenderness on lateral left knee.  Has meloxicam  at home.  Send for x-ray today. Referral placed to ortho for evaluation, may need advanced imaging.

## 2024-06-20 ENCOUNTER — Ambulatory Visit (INDEPENDENT_AMBULATORY_CARE_PROVIDER_SITE_OTHER): Admitting: Student

## 2024-06-20 DIAGNOSIS — M25562 Pain in left knee: Secondary | ICD-10-CM | POA: Diagnosis not present

## 2024-06-20 DIAGNOSIS — G8929 Other chronic pain: Secondary | ICD-10-CM

## 2024-06-20 MED ORDER — MELOXICAM 15 MG PO TABS: 15.0000 mg | ORAL_TABLET | Freq: Every day | ORAL | 0 refills | Status: AC

## 2024-06-20 NOTE — Progress Notes (Signed)
 "                                Chief Complaint: Left left knee pain    Discussed the use of AI scribe software for clinical note transcription with the patient, who gave verbal consent to proceed.  History of Present Illness Darren Crawford is a 39 year old male who presents with chronic left knee pain and swelling following a fall.  In June 2025, he sustained a direct fall onto his left knee with immediate pain and swelling but could bear weight and initially managed with rest and ice. The knee stayed sore but tolerable for several months.  Since October 2025, he has had persistent left knee swelling and dull constant circumferential pain, with sharp lateral knee pain during lateral movements or stair ascent. He frequently has mechanical symptoms with locking and the knee feeling stuck, requiring manipulation before walking again, along with weekly popping or cracking that can feel like the knee unlocking. 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 A knee brace offers limited support and does not prevent locking. Rest and intermittent meloxicam  provide partial relief. He has not tried other treatments, physical therapy, or obtained advanced imaging since the injury. He denies instability or giving way of the knee, and denies erythema, warmth, fever, chills, or sweats.   Surgical History:   None  PMH/PSH/Family History/Social History/Meds/Allergies:    Past Medical History:  Diagnosis Date   Anxiety    Asthma    Back pain    Chest pain    Chronic allergic rhinitis 08/03/2016   Chronic right shoulder pain    Constipation    GERD (gastroesophageal reflux disease)    IBS (irritable bowel syndrome)    Lactose intolerance    Morbid obesity (HCC) 08/03/2016   Multiple food allergies    Obstructive sleep apnea syndrome 08/03/2016   Palpitations    SOB (shortness of breath)    Past Surgical History:  Procedure Laterality Date    HERNIA REPAIR     SHOULDER ARTHROSCOPY WITH DISTAL CLAVICLE RESECTION Right 11/03/2022   Procedure: SHOULDER ARTHROSCOPY WITH DISTAL CLAVICLE EXCISION;  Surgeon: Cristy Bonner DASEN, MD;  Location: [facility];  Service: Orthopedics;  Laterality: Right;   SHOULDER ARTHROSCOPY WITH SUBACROMIAL DECOMPRESSION AND BICEP TENDON REPAIR Right 11/03/2022   Procedure: SHOULDER ARTHROSCOPY WITH SUBACROMIAL DECOMPRESSION AND BICEP TENDON REPAIR;  Surgeon: Cristy Bonner DASEN, MD;  Location: [facility];  Service: Orthopedics;  Laterality: Right;   TOE SURGERY     Social History   Socioeconomic History   Marital status: Married    Spouse name: Katie   Number of children: 3   Years of education: Not on file   Highest education level: Master's degree (e.g., MA, MS, MEng, MEd, MSW, MBA)  Occupational History   Occupation: Part Time Barrister's Clerk, Part time Education Officer, Environmental  Tobacco Use   Smoking status: Never   Smokeless tobacco: Never  Vaping Use   Vaping status: Never Used  Substance and Sexual Activity   Alcohol use: Never   Drug use: Never   Sexual activity: Not on file  Other Topics Concern   Not on file  Social History Narrative   Not on file   Social Drivers of Health   Tobacco Use: Low Risk (06/14/2024)   Patient History    Smoking Tobacco Use: Never    Smokeless Tobacco  Use: Never    Passive Exposure: Not on file  Financial Resource Strain: Medium Risk (09/04/2022)   Overall Financial Resource Strain (CARDIA)    Difficulty of Paying Living Expenses: Somewhat hard  Food Insecurity: Food Insecurity Present (09/04/2022)   Hunger Vital Sign    Worried About Running Out of Food in the Last Year: Sometimes true    Ran Out of Food in the Last Year: Sometimes true  Transportation Needs: No Transportation Needs (09/04/2022)   PRAPARE - Administrator, Civil Service (Medical): No    Lack of Transportation (Non-Medical): No  Physical Activity: Insufficiently Active  (09/04/2022)   Exercise Vital Sign    Days of Exercise per Week: 2 days    Minutes of Exercise per Session: 20 min  Stress: Stress Concern Present (09/04/2022)   [employer] of Occupational Health - Occupational Stress Questionnaire    Feeling of Stress : To some extent  Social Connections: Socially Integrated (09/04/2022)   Social Connection and Isolation Panel    Frequency of Communication with Friends and Family: Twice a week    Frequency of Social Gatherings with Friends and Family: Twice a week    Attends Religious Services: More than 4 times per year    Active Member of [employer] or Organizations: Yes    Attends Banker Meetings: More than 4 times per year    Marital Status: Married  Depression (PHQ2-9): Medium Risk (09/04/2023)   Depression (PHQ2-9)    PHQ-2 Score: 10  Alcohol Screen: Not on file  Housing: Low Risk (09/04/2022)   Housing    Last Housing Risk Score: 0  Utilities: Not on file  Health Literacy: Not on file   Family History  Problem Relation Age of Onset   Thyroid disease Mother    Healthy Mother    Idiopathic pulmonary fibrosis Father        from [employer] substances per pt   Anxiety disorder Father    Depression Father    Sleep apnea Father    Alcoholism Father    Allergies[1] Current Outpatient Medications  Medication Sig Dispense Refill   albuterol  (VENTOLIN  HFA) 108 (90 Base) MCG/ACT inhaler Inhale 1 puff into the lungs every 6 (six) hours as needed for wheezing or shortness of breath. 6.7 g 1   amitriptyline  (ELAVIL ) 25 MG tablet TAKE 1 TABLET BY MOUTH EVERYDAY AT BEDTIME 90 tablet 1   azelastine (OPTIVAR) 0.05 % ophthalmic solution Apply 1 drop to eye 2 (two) times daily.     cetirizine  (ZYRTEC ) 10 MG chewable tablet Chew 1 tablet (10 mg total) by mouth daily. 90 tablet 1   fluticasone  (FLONASE ) 50 MCG/ACT nasal spray Place 1 spray into both nostrils daily. 16 g 5   fluticasone -salmeterol (ADVAIR) 100-50 MCG/ACT AEPB Inhale 1 puff into the  lungs 2 (two) times daily. 60 each 5   Misc. Devices MISC Start AutoCPAP at 5-15 cm. water pressure.  Prefer Resmed S11 AutoCPAP machine with a mask, supplies and heated tubing for moderate OSA with AHI 28. Please do mask fitting and use a mask of patient preference.  Send to a local DME.     Phentermine  HCl (LOMAIRA ) 8 MG TABS 1 tab po 30 min before breakfast and 1 tab po at 2 pm 28 tablet 0   Current Facility-Administered Medications  Medication Dose Route Frequency Provider Last Rate Last Admin   triamcinolone  acetonide (KENALOG -40) injection 40 mg  40 mg Intramuscular Once Thekkekandam, Thomas J, MD  No results found.  Review of Systems:   A ROS was performed including pertinent positives and negatives as documented in the HPI.  Physical Exam :   Constitutional: NAD and appears stated age Neurological: Alert and oriented Psych: Appropriate affect and cooperative There were no vitals taken for this visit.   Comprehensive Musculoskeletal Exam:    Exam of the left knee demonstrates tenderness over the lateral joint line.  Active range of motion is from 0 to 110 degrees without palpable crepitus.  Stable collateral ligaments with varus and valgus stress.  No increased patellar translation.  Mild effusion without erythema or warmth.  Positive McMurray.  Imaging:   Xray review from 06/14/2024 (right knee 4 views): Negative for acute bony abnormalities or notable degenerative changes.   I personally reviewed and interpreted the radiographs.      Assessment & Plan Left knee pain with suspected lateral meniscus tear Chronic left knee pain and swelling with mechanical symptoms due to a fall suggest a lateral meniscus tear causing mechanical obstruction and inflammation.  Recent radiographs show no significant osseous abnormality. Conservative management is unlikely to be effective, so further imaging is needed. A right knee MRI has been ordered at [facility] imaging center for  evaluation with focus on meniscal pathology. He is advised to modify activities, avoid excessive knee stress, and use rest and ice for swelling. Meloxicam  is recommended and refilled for pain and inflammation. Follow-up is instructed within a few days to a week post-MRI for imaging review and management planning.      I personally saw and evaluated the patient, and participated in the management and treatment plan.  Leonce Reveal, PA-C Orthopedics     [1]  Allergies Allergen Reactions   Ibuprofen Rash   Penicillins Rash   Prednisone  Anxiety   "

## 2024-06-25 ENCOUNTER — Encounter: Payer: Self-pay | Admitting: Family Medicine

## 2024-07-14 ENCOUNTER — Other Ambulatory Visit

## 2024-07-22 ENCOUNTER — Other Ambulatory Visit

## 2024-07-26 ENCOUNTER — Ambulatory Visit (HOSPITAL_BASED_OUTPATIENT_CLINIC_OR_DEPARTMENT_OTHER): Admitting: Student

## 2024-09-04 ENCOUNTER — Encounter: Admitting: Family Medicine
